# Patient Record
Sex: Female | Born: 1985 | Race: Black or African American | Hispanic: No | Marital: Married | Smoking: Never smoker
Health system: Southern US, Community
[De-identification: ages and names within clinical notes are randomized; demographics above are authoritative.]

## PROBLEM LIST (undated history)

## (undated) ENCOUNTER — Inpatient Hospital Stay (HOSPITAL_COMMUNITY): Payer: Medicaid Other

## (undated) ENCOUNTER — Emergency Department (HOSPITAL_COMMUNITY): Admission: EM | Payer: BLUE CROSS/BLUE SHIELD | Source: Home / Self Care

## (undated) DIAGNOSIS — D219 Benign neoplasm of connective and other soft tissue, unspecified: Secondary | ICD-10-CM

## (undated) DIAGNOSIS — O24419 Gestational diabetes mellitus in pregnancy, unspecified control: Secondary | ICD-10-CM

## (undated) DIAGNOSIS — Z789 Other specified health status: Secondary | ICD-10-CM

## (undated) HISTORY — DX: Gestational diabetes mellitus in pregnancy, unspecified control: O24.419

## (undated) HISTORY — DX: Other specified health status: Z78.9

## (undated) HISTORY — DX: Benign neoplasm of connective and other soft tissue, unspecified: D21.9

## (undated) HISTORY — PX: NO PAST SURGERIES: SHX2092

---

## 2018-11-04 ENCOUNTER — Emergency Department (HOSPITAL_COMMUNITY): Payer: Self-pay

## 2018-11-04 ENCOUNTER — Encounter (HOSPITAL_COMMUNITY): Payer: Self-pay

## 2018-11-04 ENCOUNTER — Emergency Department (HOSPITAL_COMMUNITY)
Admission: EM | Admit: 2018-11-04 | Discharge: 2018-11-04 | Disposition: A | Discharge status: Home / Self Care | Payer: Self-pay | Attending: Emergency Medicine | Admitting: Emergency Medicine

## 2018-11-04 ENCOUNTER — Other Ambulatory Visit: Payer: Self-pay

## 2018-11-04 DIAGNOSIS — J069 Acute upper respiratory infection, unspecified: Secondary | ICD-10-CM | POA: Insufficient documentation

## 2018-11-04 DIAGNOSIS — R0981 Nasal congestion: Secondary | ICD-10-CM | POA: Insufficient documentation

## 2018-11-04 DIAGNOSIS — B9789 Other viral agents as the cause of diseases classified elsewhere: Secondary | ICD-10-CM

## 2018-11-04 DIAGNOSIS — R05 Cough: Secondary | ICD-10-CM | POA: Insufficient documentation

## 2018-11-04 DIAGNOSIS — J988 Other specified respiratory disorders: Secondary | ICD-10-CM

## 2018-11-04 MED ORDER — BENZONATATE 100 MG PO CAPS: 100.0000 mg | ORAL_CAPSULE | Freq: Three times a day (TID) | ORAL | 0 refills | Status: DC | PRN

## 2018-11-04 MED ORDER — ACETAMINOPHEN ER 650 MG PO TBCR: 650.0000 mg | EXTENDED_RELEASE_TABLET | Freq: Three times a day (TID) | ORAL | 0 refills | Status: DC | PRN

## 2018-11-04 MED ORDER — FLUTICASONE PROPIONATE 50 MCG/ACT NA SUSP: 1.0000 | Freq: Every day | NASAL | 0 refills | Status: DC

## 2018-11-04 NOTE — ED Triage Notes (Signed)
Patient complains of intermittent congestion, dry cough and reports fever x 9 days. Lungs clear, no fever, no cough during assessment

## 2018-11-04 NOTE — ED Provider Notes (Signed)
[facility] EMERGENCY DEPARTMENT Provider Note   CSN: [id number] Arrival date & time: 11/04/18  1445    History   Chief Complaint Chief Complaint  Patient presents with  . Fever    HPI Nicole Lindsey is a 33 y.o. female without significant past medical hx who presents to the ED for subjective fevers x 9 days. Fevers are associated w/ nasal congestion & dry cough. Sxs constant, improved with OTC cold medicine, no other alleviating/aggravating factors. Denies ear pain, sore throat, dyspnea, chest pain, N/V/D, or abdominal pain. No recent travel. Her co-worker's husband who she has not been in direct contact w/ tested positive for covid 21, co-worker did not. Otherwise no confirmed covid 19 exposures.    Translator line utilized throughout encounter     HPI  History reviewed. No pertinent past medical history.  There are no active problems to display for this patient.   History reviewed. No pertinent surgical history.   OB History   No obstetric history on file.      Home Medications    Prior to Admission medications   Not on File    Family History No family history on file.  Social History Social History   Tobacco Use  . Smoking status: Not on file  Substance Use Topics  . Alcohol use: Not on file  . Drug use: Not on file     Allergies   Patient has no known allergies.   Review of Systems Review of Systems  Constitutional: Positive for fever.  HENT: Positive for congestion. Negative for ear pain and sore throat.   Respiratory: Positive for cough. Negative for shortness of breath.   Cardiovascular: Negative for chest pain, palpitations and leg swelling.  Gastrointestinal: Negative for abdominal pain, diarrhea, nausea and vomiting.  Genitourinary: Negative for dysuria.  Musculoskeletal: Negative for myalgias.  Neurological: Negative for syncope.  All other systems reviewed and are negative.    Physical Exam Updated Vital Signs BP  125/82 (BP Location: Right Arm)   Pulse 84   Temp 98.6 F (37 C) (Oral)   Resp 18   SpO2 99%   Physical Exam Vitals signs and nursing note reviewed.  Constitutional:      General: She is not in acute distress.    Appearance: She is well-developed.  HENT:     Head: Normocephalic and atraumatic.     Right Ear: Ear canal normal. Tympanic membrane is not perforated, erythematous, retracted or bulging.     Left Ear: Ear canal normal. Tympanic membrane is not perforated, erythematous, retracted or bulging.     Ears:     Comments: No mastoid erythema/swelling/tenderness.     Nose: Congestion present.     Right Sinus: No maxillary sinus tenderness or frontal sinus tenderness.     Left Sinus: No maxillary sinus tenderness or frontal sinus tenderness.     Mouth/Throat:     Pharynx: Uvula midline. No oropharyngeal exudate or posterior oropharyngeal erythema.     Comments: Posterior oropharynx is symmetric appearing. Patient tolerating own secretions without difficulty. No trismus. No drooling. No hot potato voice. No swelling beneath the tongue, submandibular compartment is soft.  Eyes:     General:        Right eye: No discharge.        Left eye: No discharge.     Conjunctiva/sclera: Conjunctivae normal.     Pupils: Pupils are equal, round, and reactive to light.  Neck:     Musculoskeletal: Normal range  of motion and neck supple. No edema or neck rigidity.  Cardiovascular:     Rate and Rhythm: Normal rate and regular rhythm.     Heart sounds: No murmur.  Pulmonary:     Effort: Pulmonary effort is normal. No respiratory distress.     Breath sounds: Normal breath sounds. No wheezing, rhonchi or rales.  Abdominal:     General: There is no distension.     Palpations: Abdomen is soft.     Tenderness: There is no abdominal tenderness.  Lymphadenopathy:     Cervical: No cervical adenopathy.  Skin:    General: Skin is warm and dry.     Findings: No rash.  Neurological:     Mental  Status: She is alert.  Psychiatric:        Behavior: Behavior normal.    ED Treatments / Results  Labs (all labs ordered are listed, but only abnormal results are displayed) Labs Reviewed - No data to display  EKG None  Radiology Dg Chest 2 View  Result Date: 11/04/2018 CLINICAL DATA:  Nonproductive cough and fever for 9 days. EXAM: CHEST - 2 VIEW COMPARISON:  None. FINDINGS: The cardiomediastinal silhouette is within normal limits. The lungs are slightly hypoinflated with slight accentuation of interstitial markings in the lung bases. No airspace consolidation, overt edema, pleural effusion, or pneumothorax is identified. No acute osseous abnormality is seen. IMPRESSION: Slight bibasilar density favored to reflect minimal atelectasis. Electronically Signed   By: Logan Bores M.D.   On: 11/04/2018 15:45    Procedures Procedures (including critical care time)  Medications Ordered in ED Medications - No data to display   Initial Impression / Assessment and Plan / ED Course  I have reviewed the triage vital signs and the nursing notes.  Pertinent labs & imaging results that were available during my care of the patient were reviewed by me and considered in my medical decision making (see chart for details).   Patient presents to the ED with fever, congestion, & dry cough. Nontoxic appearing, vitals WNL. Exam benign. No sinus tenderness, doubt acute bacterial sinusitis. Centor 0- doubt strep. No AOM on exam. No meningismus. Lungs CTA- no wheezing/respiratory distress. CXR negative for infiltrate doubt pneumonia. CXR w/ findings radiology suspected to reflect atelectasis. Overall sxs are likely viral, possibly covid 19, does not meet hospital criteria for testing, no sxs of dyspnea, SpO2> 95%, appears safe for discharge with quarantine instructions & symptomatic care. I discussed results, treatment plan, need for follow-up, and return precautions with the patient. Provided opportunity for  questions, patient confirmed understanding and is in agreement with plan.    Nicole Lindsey was evaluated in Emergency Department on 11/04/2018 for the symptoms described in the history of present illness. He/she was evaluated in the context of the global COVID-19 pandemic, which necessitated consideration that the patient might be at risk for infection with the SARS-CoV-2 virus that causes COVID-19. Institutional protocols and algorithms that pertain to the evaluation of patients at risk for COVID-19 are in a state of rapid change based on information released by regulatory bodies including the CDC and federal and state organizations. These policies and algorithms were followed during the patient's care in the ED.   Final Clinical Impressions(s) / ED Diagnoses   Final diagnoses:  Viral respiratory illness    ED Discharge Orders         Ordered    fluticasone (FLONASE) 50 MCG/ACT nasal spray  Daily     11/04/18 1619  acetaminophen (TYLENOL 8 HOUR) 650 MG CR tablet  Every 8 hours PRN     11/04/18 1619    benzonatate (TESSALON) 100 MG capsule  3 times daily PRN     11/04/18 1619           Hamdi Vari, Glynda Jaeger, PA-C 11/04/18 1810    Isla Pence, MD 11/04/18 [phone]

## 2018-11-04 NOTE — Discharge Instructions (Signed)
You were seen in the emergency today for upper respiratory symptoms, we suspect your symptoms are related to a virus, possibly covid 19. We are sending you home with the following medicines.   -Flonase to be used 1 spray in each nostril daily.  This medication is used to treat your congestion.  -Tessalon can be taken once every 8 hours as needed.  This medication is used to treat your cough.  -Tylenol- to take every 8 hours as needed for fever.   We have prescribed you new medication(s) today. Discuss the medications prescribed today with your pharmacist as they can have adverse effects and interactions with your other medicines including over the counter and prescribed medications. Seek medical evaluation if you start to experience new or abnormal symptoms after taking one of these medicines, seek care immediately if you start to experience difficulty breathing, feeling of your throat closing, facial swelling, or rash as these could be indications of a more serious allergic reaction  We have limited coronavirus testing at this time. We are instructing patient's with cough to quarantine themselves for 14 days. You may be able to discontinue self quarantine if the following conditions are met:   Persons with COVID-19 who have symptoms and were directed to care for themselves at home may discontinue home isolation under the  following conditions: - It has been at least 7 days have passed since symptoms first appeared. - AND at least 3 days (72 hours) have passed since recovery defined as resolution of fever without the use of fever-reducing medications and improvement in respiratory symptoms (e.g., cough, shortness of breath)  Please follow the below quarantine instructions.   Please follow up with primary care within 3-5 days for re-evaluation- call prior to going to the office to make them aware of your symptoms. Return to the ER for new or worsening symptoms including but not limited to increased  work of breathing, fever, chest pain, passing out, or any other concerns.       Person Under Monitoring Name: Nicole Lindsey  Location: [place] [address]   Infection Prevention Recommendations for Individuals Confirmed to have, or Being Evaluated for, 2019 Novel Coronavirus (COVID-19) Infection Who Receive Care at Home  Individuals who are confirmed to have, or are being evaluated for, COVID-19 should follow the prevention steps below until a healthcare provider or local or state health department says they can return to normal activities.  Stay home except to get medical care You should restrict activities outside your home, except for getting medical care. Do not go to work, school, or public areas, and do not use public transportation or taxis.  Call ahead before visiting your doctor Before your medical appointment, call the healthcare provider and tell them that you have, or are being evaluated for, COVID-19 infection. This will help the healthcare providers office take steps to keep other people from getting infected. Ask your healthcare provider to call the local or state health department.  Monitor your symptoms Seek prompt medical attention if your illness is worsening (e.g., difficulty breathing). Before going to your medical appointment, call the healthcare provider and tell them that you have, or are being evaluated for, COVID-19 infection. Ask your healthcare provider to call the local or state health department.  Wear a facemask You should wear a facemask that covers your nose and mouth when you are in the same room with other people and when you visit a healthcare provider. People who live with  or visit you should also wear a facemask while they are in the same room with you.  Separate yourself from other people in your home As much as possible, you should stay in a different room from other people in your home. Also, you should use a  separate bathroom, if available.  Avoid sharing household items You should not share dishes, drinking glasses, cups, eating utensils, towels, bedding, or other items with other people in your home. After using these items, you should wash them thoroughly with soap and water.  Cover your coughs and sneezes Cover your mouth and nose with a tissue when you cough or sneeze, or you can cough or sneeze into your sleeve. Throw used tissues in a lined trash can, and immediately wash your hands with soap and water for at least 20 seconds or use an alcohol-based hand rub.  Wash your [facility] your hands often and thoroughly with soap and water for at least 20 seconds. You can use an alcohol-based hand sanitizer if soap and water are not available and if your hands are not visibly dirty. Avoid touching your eyes, nose, and mouth with unwashed hands.   Prevention Steps for Caregivers and Household Members of Individuals Confirmed to have, or Being Evaluated for, COVID-19 Infection Being Cared for in the Home  If you live with, or provide care at home for, a person confirmed to have, or being evaluated for, COVID-19 infection please follow these guidelines to prevent infection:  Follow healthcare providers instructions Make sure that you understand and can help the patient follow any healthcare provider instructions for all care.  Provide for the patients basic needs You should help the patient with basic needs in the home and provide support for getting groceries, prescriptions, and other personal needs.  Monitor the patients symptoms If they are getting sicker, call his or her medical provider and tell them that the patient has, or is being evaluated for, COVID-19 infection. This will help the healthcare providers office take steps to keep other people from getting infected. Ask the healthcare provider to call the local or state health department.  Limit the number of people who have  contact with the patient If possible, have only one caregiver for the patient. Other household members should stay in another home or place of residence. If this is not possible, they should stay in another room, or be separated from the patient as much as possible. Use a separate bathroom, if available. Restrict visitors who do not have an essential need to be in the home.  Keep older adults, very young children, and other sick people away from the patient Keep older adults, very young children, and those who have compromised immune systems or chronic health conditions away from the patient. This includes people with chronic heart, lung, or kidney conditions, diabetes, and cancer.  Ensure good ventilation Make sure that shared spaces in the home have good air flow, such as from an air conditioner or an opened window, weather permitting.  Wash your hands often Wash your hands often and thoroughly with soap and water for at least 20 seconds. You can use an alcohol based hand sanitizer if soap and water are not available and if your hands are not visibly dirty. Avoid touching your eyes, nose, and mouth with unwashed hands. Use disposable paper towels to dry your hands. If not available, use dedicated cloth towels and replace them when they become wet.  Wear a facemask and gloves Wear a disposable  facemask at all times in the room and gloves when you touch or have contact with the patients blood, body fluids, and/or secretions or excretions, such as sweat, saliva, sputum, nasal mucus, vomit, urine, or feces.  Ensure the mask fits over your nose and mouth tightly, and do not touch it during use. Throw out disposable facemasks and gloves after using them. Do not reuse. Wash your hands immediately after removing your facemask and gloves. If your personal clothing becomes contaminated, carefully remove clothing and launder. Wash your hands after handling contaminated clothing. Place all used  disposable facemasks, gloves, and other waste in a lined container before disposing them with other household waste. Remove gloves and wash your hands immediately after handling these items.  Do not share dishes, glasses, or other household items with the patient Avoid sharing household items. You should not share dishes, drinking glasses, cups, eating utensils, towels, bedding, or other items with a patient who is confirmed to have, or being evaluated for, COVID-19 infection. After the person uses these items, you should wash them thoroughly with soap and water.  Wash laundry thoroughly Immediately remove and wash clothes or bedding that have blood, body fluids, and/or secretions or excretions, such as sweat, saliva, sputum, nasal mucus, vomit, urine, or feces, on them. Wear gloves when handling laundry from the patient. Read and follow directions on labels of laundry or clothing items and detergent. In general, wash and dry with the warmest temperatures recommended on the label.  Clean all areas the individual has used often Clean all touchable surfaces, such as counters, tabletops, doorknobs, bathroom fixtures, toilets, phones, keyboards, tablets, and bedside tables, every day. Also, clean any surfaces that may have blood, body fluids, and/or secretions or excretions on them. Wear gloves when cleaning surfaces the patient has come in contact with. Use a diluted bleach solution (e.g., dilute bleach with 1 part bleach and 10 parts water) or a household disinfectant with a label that says EPA-registered for coronaviruses. To make a bleach solution at home, add 1 tablespoon of bleach to 1 quart (4 cups) of water. For a larger supply, add  cup of bleach to 1 gallon (16 cups) of water. Read labels of cleaning products and follow recommendations provided on product labels. Labels contain instructions for safe and effective use of the cleaning product including precautions you should take when applying  the product, such as wearing gloves or eye protection and making sure you have good ventilation during use of the product. Remove gloves and wash hands immediately after cleaning.  Monitor yourself for signs and symptoms of illness Caregivers and household members are considered close contacts, should monitor their health, and will be asked to limit movement outside of the home to the extent possible. Follow the monitoring steps for close contacts listed on the symptom monitoring form.   ? If you have additional questions, contact your local health department or call the epidemiologist on call at [phone] (available 24/7). ? This guidance is subject to change. For the most up-to-date guidance from [facility], please refer to their website: [email or web address]

## 2018-11-15 ENCOUNTER — Encounter (HOSPITAL_COMMUNITY): Payer: Self-pay | Admitting: *Deleted

## 2018-11-15 ENCOUNTER — Other Ambulatory Visit: Payer: Self-pay

## 2018-11-15 ENCOUNTER — Emergency Department (HOSPITAL_COMMUNITY)
Admission: EM | Admit: 2018-11-15 | Discharge: 2018-11-15 | Disposition: A | Discharge status: Home / Self Care | Payer: BLUE CROSS/BLUE SHIELD | Attending: Emergency Medicine | Admitting: Emergency Medicine

## 2018-11-15 DIAGNOSIS — J019 Acute sinusitis, unspecified: Secondary | ICD-10-CM | POA: Diagnosis not present

## 2018-11-15 DIAGNOSIS — J329 Chronic sinusitis, unspecified: Secondary | ICD-10-CM

## 2018-11-15 DIAGNOSIS — R05 Cough: Secondary | ICD-10-CM | POA: Diagnosis present

## 2018-11-15 MED ORDER — ACETAMINOPHEN 325 MG PO TABS
650.0000 mg | ORAL_TABLET | Freq: Once | ORAL | Status: AC
  Administered 2018-11-15: 650 mg via ORAL
  Filled 2018-11-15: qty 2

## 2018-11-15 MED ORDER — AMOXICILLIN-POT CLAVULANATE 875-125 MG PO TABS: 1.0000 | ORAL_TABLET | Freq: Two times a day (BID) | ORAL | 0 refills | Status: AC

## 2018-11-15 NOTE — Discharge Instructions (Addendum)
°  You have been seen today for sinus congestion. Please read and follow all provided instructions. Return to the emergency room for worsening condition or new concerning symptoms.    1. Medications:  Prescription printed for Augmentin.  This is an antibiotic to help with your sinus infection.    Take medications as prescribed. Please review all of the medicines and only take them if you do not have an allergy to them.  Continue usual home medications  2. Treatment: rest, drink plenty of fluids  3. Follow Up: Please follow up with your primary doctor in 2-5 days for discussion of your diagnoses and further evaluation after today's visit; Call today to arrange your follow up.  If you do not have a primary care doctor use the resource guide provided to find one;  I have also included the information for [facility].  Please call to schedule follow-up appointment there if able to establish care with a primary care doctor.  It is also a possibility that you have an allergic reaction to any of the medicines that you have been prescribed - Everybody reacts differently to medications and while MOST people have no trouble with most medicines, you may have a reaction such as nausea, vomiting, rash, swelling, shortness of breath. If this is the case, please stop taking the medicine immediately and contact your physician.  ?

## 2018-11-15 NOTE — ED Provider Notes (Signed)
[facility] EMERGENCY DEPARTMENT Provider Note   CSN: [id number] Arrival date & time:       History   Chief Complaint Chief Complaint  Patient presents with  . Cough    HPI Encounter performed with audio [place] interpretor.  Nicole Lindsey is a 33 y.o. female otherwise healthy female presenting to emergency department today with chief complaint of nasal congestion x10 days.  Patient states she was seen here on 11 days ago with a cough and discharged home with Lewayne Bunting and Flonase.  Patient states the cough has improved and is no longer bothering her.  She first noticed she had nasal congestion and sinus tenderness after being discharged from her that ED visit. She describes yellow mucus. She has associated fever with T max of 99.4 yesterday.  She has not taken any medications for the fever but has attempted rubbing onion on her chest.  She has low-grade fever on arrival of 99.3.  Patient also states her tongue is sore and she noticed a bump on it.  She is unsure when this started, thinks it has been a few days. She denies loss of taste and smell, sore throat, dyspnea, chest pain, abdominal pain, nausea, vomiting, diarrhea.  Patient denies any sick contacts.  Also denies any contact with one positive for COVID-19. She has been self isolating since the last ED visit as recommended by the provider.  History provided by patient and through chart review.   History reviewed. No pertinent past medical history.  There are no active problems to display for this patient.   History reviewed. No pertinent surgical history.   OB History   No obstetric history on file.      Home Medications    Prior to Admission medications   Medication Sig Start Date End Date Taking? Authorizing Provider  acetaminophen (TYLENOL 8 HOUR) 650 MG CR tablet Take 1 tablet (650 mg total) by mouth every 8 (eight) hours as needed for pain. 11/04/18   Petrucelli, Samantha R, PA-C   amoxicillin-clavulanate (AUGMENTIN) 875-125 MG tablet Take 1 tablet by mouth every 12 (twelve) hours for 7 days. 11/15/18 11/22/18  Jeidy Hoerner E, PA-C  benzonatate (TESSALON) 100 MG capsule Take 1 capsule (100 mg total) by mouth 3 (three) times daily as needed for cough. 11/04/18   Petrucelli, Samantha R, PA-C  fluticasone (FLONASE) 50 MCG/ACT nasal spray Place 1 spray into both nostrils daily. 11/04/18   Petrucelli, Glynda Jaeger, PA-C    Family History History reviewed. No pertinent family history.  Social History Social History   Tobacco Use  . Smoking status: Never Smoker  . Smokeless tobacco: Never Used  Substance Use Topics  . Alcohol use: Not Currently  . Drug use: Not Currently     Allergies   Patient has no known allergies.   Review of Systems Review of Systems  Constitutional: Positive for fever. Negative for chills.  HENT: Positive for congestion, sinus pressure and sinus pain. Negative for ear pain, facial swelling, mouth sores, sore throat and trouble swallowing.   Eyes: Negative for pain, discharge, redness and itching.  Respiratory: Positive for cough. Negative for shortness of breath.   Cardiovascular: Negative for chest pain and leg swelling.  Gastrointestinal: Negative for abdominal pain, diarrhea, nausea and vomiting.  Genitourinary: Negative for dysuria and hematuria.  Musculoskeletal: Negative for arthralgias and myalgias.  Skin: Negative for wound.  Neurological: Negative for dizziness and headaches.     Physical Exam Updated Vital Signs BP 128/81 (BP  Location: Right Arm)   Pulse 61   Temp 99.3 F (37.4 C) (Oral)   Resp 14   SpO2 99%   Physical Exam Vitals signs and nursing note reviewed.  Constitutional:      Appearance: She is well-developed. She is not toxic-appearing.  HENT:     Head: Normocephalic.     Comments: Maxillary and frontal sinus tenderness.     Right Ear: Tympanic membrane and external ear normal. No middle ear effusion.  Tympanic membrane is not erythematous.     Left Ear: Tympanic membrane and external ear normal.  No middle ear effusion. Tympanic membrane is not erythematous.     Nose: Congestion present.     Mouth/Throat:     Comments: No erythema to oropharynx, no edema, no exudate, no tonsillar swelling, voice normal, neck supple without lymphadenopathy. There is no notable swelling of tongue. There is one aphthous ulcer on left inner cheek without surrounding edema or drainage. Eyes:     General: No allergic shiner or scleral icterus.       Right eye: No discharge.        Left eye: No discharge.     Conjunctiva/sclera: Conjunctivae normal.     Right eye: Right conjunctiva is not injected.     Left eye: Left conjunctiva is not injected.  Neck:     Musculoskeletal: Normal range of motion.  Cardiovascular:     Rate and Rhythm: Normal rate and regular rhythm.     Pulses: Normal pulses.     Heart sounds: Normal heart sounds.  Pulmonary:     Effort: Pulmonary effort is normal.     Breath sounds: Normal breath sounds.  Abdominal:     General: There is no distension.  Musculoskeletal: Normal range of motion.  Skin:    General: Skin is warm and dry.  Neurological:     Mental Status: She is oriented to person, place, and time.     Comments: Fluent speech, no facial droop.  Psychiatric:        Behavior: Behavior normal.      ED Treatments / Results  Labs (all labs ordered are listed, but only abnormal results are displayed) Labs Reviewed - No data to display  EKG None  Radiology No results found.  Procedures Procedures (including critical care time)  Medications Ordered in ED Medications  acetaminophen (TYLENOL) tablet 650 mg (650 mg Oral Given 11/15/18 2034)     Initial Impression / Assessment and Plan / ED Course  I have reviewed the triage vital signs and the nursing notes.  Pertinent labs & imaging results that were available during my care of the patient were reviewed by me  and considered in my medical decision making (see chart for details).  Pt with low grade fever of 99.3 on arrival. She has not taken any antipyretics in the last several days. She reports her cough has improved. She had a chest xray at previous ED visit that showed was negative for infiltrate, given that cough has improved do not feel chest xray should be repeated today in an attempt to subject pt to less radiation exposure. Pt given PO tylenol for fever here.  Her symptoms today are suggestive of sinusitis.   Severe symptoms have been present for 10 days with purulent nasal discharge and maxillary sinus pain.  Concern for acute bacterial rhinosinusitis.  Patient discharged with Augmentin.  Instructions given for warm saline nasal wash and recommendations for follow-up with primary care physician.  She  does not have a pcp, she was given information for  community health and wellness clinic with discharge paperwork. Strict return precautions for returning to the ED were discussed.     Nicole Lindsey was evaluated in Emergency Department on 11/16/2018 for the symptoms described in the history of present illness. She was evaluated in the context of the global COVID-19 pandemic, which necessitated consideration that the patient might be at risk for infection with the SARS-CoV-2 virus that causes COVID-19. Institutional protocols and algorithms that pertain to the evaluation of patients at risk for COVID-19 are in a state of rapid change based on information released by regulatory bodies including the CDC and federal and state organizations. These policies and algorithms were followed during the patient's care in the ED.   Final Clinical Impressions(s) / ED Diagnoses   Final diagnoses:  Sinusitis, unspecified chronicity, unspecified location    ED Discharge Orders         Ordered    amoxicillin-clavulanate (AUGMENTIN) 875-125 MG tablet  Every 12 hours     11/15/18 2029           Cherre Robins, PA-C 11/16/18 1306    Lajean Saver, MD 11/16/18 [phone]

## 2018-11-15 NOTE — ED Triage Notes (Signed)
PT reports last seen on 4-15 with same cough . Cough has not improved. Pt reports her mouth is sore ,this is new.

## 2018-11-24 ENCOUNTER — Emergency Department (HOSPITAL_COMMUNITY)
Admission: EM | Admit: 2018-11-24 | Discharge: 2018-11-25 | Disposition: A | Discharge status: Home / Self Care | Payer: BLUE CROSS/BLUE SHIELD | Attending: Emergency Medicine | Admitting: Emergency Medicine

## 2018-11-24 ENCOUNTER — Other Ambulatory Visit: Payer: Self-pay

## 2018-11-24 ENCOUNTER — Encounter (HOSPITAL_COMMUNITY): Payer: Self-pay | Admitting: Emergency Medicine

## 2018-11-24 ENCOUNTER — Emergency Department (HOSPITAL_COMMUNITY): Payer: BLUE CROSS/BLUE SHIELD

## 2018-11-24 DIAGNOSIS — B9789 Other viral agents as the cause of diseases classified elsewhere: Secondary | ICD-10-CM

## 2018-11-24 DIAGNOSIS — Z79899 Other long term (current) drug therapy: Secondary | ICD-10-CM | POA: Insufficient documentation

## 2018-11-24 DIAGNOSIS — J069 Acute upper respiratory infection, unspecified: Secondary | ICD-10-CM | POA: Diagnosis not present

## 2018-11-24 DIAGNOSIS — J988 Other specified respiratory disorders: Principal | ICD-10-CM

## 2018-11-24 DIAGNOSIS — R0600 Dyspnea, unspecified: Secondary | ICD-10-CM | POA: Diagnosis present

## 2018-11-24 MED ORDER — IBUPROFEN 400 MG PO TABS
400.0000 mg | ORAL_TABLET | Freq: Once | ORAL | Status: AC
  Administered 2018-11-25: 400 mg via ORAL
  Filled 2018-11-24: qty 1

## 2018-11-24 MED ORDER — ALBUTEROL SULFATE HFA 108 (90 BASE) MCG/ACT IN AERS
2.0000 | INHALATION_SPRAY | Freq: Once | RESPIRATORY_TRACT | Status: AC
  Administered 2018-11-25: 2 via RESPIRATORY_TRACT
  Filled 2018-11-24: qty 6.7

## 2018-11-24 MED ORDER — PREDNISONE 20 MG PO TABS
60.0000 mg | ORAL_TABLET | Freq: Once | ORAL | Status: AC
  Administered 2018-11-25: 60 mg via ORAL
  Filled 2018-11-24: qty 3

## 2018-11-24 NOTE — ED Provider Notes (Signed)
[facility] EMERGENCY DEPARTMENT Provider Note   CSN: [id number] Arrival date & time: 11/24/18  2227    History   Chief Complaint Chief Complaint  Patient presents with  . Shortness of Breath    HPI Nicole Lindsey is a 33 y.o. female.   The history is provided by the patient. A language interpreter was used [place]).  She comes in because of difficulty breathing tonight.  She has had a respiratory illness for the last 3 weeks and has been seen in the emergency department twice for that.  10 days ago she was given a prescription for Augmentin for presumed sinusitis and that has improved.  She still has occasional nasal drainage.  She does have a cough which is intermittently productive of white sputum.  She is also complaining of pain in the interscapular area.  She denies other arthralgias or myalgias.  She denies sore throat, nausea, vomiting, diarrhea.  She has had subjective fevers without chills or sweats.  She denies any sick contacts.  History reviewed. No pertinent past medical history.  There are no active problems to display for this patient.   History reviewed. No pertinent surgical history.   OB History   No obstetric history on file.      Home Medications    Prior to Admission medications   Medication Sig Start Date End Date Taking? Authorizing Provider  acetaminophen (TYLENOL 8 HOUR) 650 MG CR tablet Take 1 tablet (650 mg total) by mouth every 8 (eight) hours as needed for pain. 11/04/18   Petrucelli, Samantha R, PA-C  benzonatate (TESSALON) 100 MG capsule Take 1 capsule (100 mg total) by mouth 3 (three) times daily as needed for cough. 11/04/18   Petrucelli, Samantha R, PA-C  fluticasone (FLONASE) 50 MCG/ACT nasal spray Place 1 spray into both nostrils daily. 11/04/18   Petrucelli, Glynda Jaeger, PA-C    Family History History reviewed. No pertinent family history.  Social History Social History   Tobacco Use  . Smoking status: Never Smoker  .  Smokeless tobacco: Never Used  Substance Use Topics  . Alcohol use: Not Currently  . Drug use: Not Currently     Allergies   Patient has no known allergies.   Review of Systems Review of Systems  All other systems reviewed and are negative.    Physical Exam Updated Vital Signs BP 132/76 (BP Location: Right Arm)   Pulse 79   Temp 98.5 F (36.9 C) (Oral)   Resp 18   LMP 11/17/2018 (Approximate)   SpO2 97%   Physical Exam Vitals signs and nursing note reviewed.    33 year old female, resting comfortably and in no acute distress. Vital signs are normal. Oxygen saturation is 97%, which is normal. Head is normocephalic and atraumatic. PERRLA, EOMI. Oropharynx is clear. Neck is nontender and supple without adenopathy or JVD. Back is nontender and there is no CVA tenderness. Lungs are clear without rales, wheezes, or rhonchi.  Exhalation phase is slightly prolonged. Chest is nontender. Heart has regular rate and rhythm without murmur. Abdomen is soft, flat, nontender without masses or hepatosplenomegaly and peristalsis is normoactive. Extremities have no cyanosis or edema, full range of motion is present. Skin is warm and dry without rash. Neurologic: Mental status is normal, cranial nerves are intact, there are no motor or sensory deficits.  ED Treatments / Results   EKG EKG Interpretation  Date/Time:  Tuesday Nov 24 2018 23:02:09 EDT Ventricular Rate:  73 PR Interval:  162  QRS Duration: 84 QT Interval:  368 QTC Calculation: 405 R Axis:   68 Text Interpretation:  Normal sinus rhythm Normal ECG No old tracing to compare Confirmed by Delora Fuel ([id number]) on 11/24/2018 11:21:59 PM   Radiology Dg Chest 2 View  Result Date: 11/24/2018 CLINICAL DATA:  33 y/o  F; shortness of breath. EXAM: CHEST - 2 VIEW COMPARISON:  11/04/2018 chest radiograph. FINDINGS: Stable heart size and mediastinal contours are within normal limits. Both lungs are clear. The visualized skeletal  structures are unremarkable. IMPRESSION: No acute pulmonary process identified. Electronically Signed   By: Kristine Garbe M.D.   On: 11/24/2018 23:34    Procedures Procedures   Medications Ordered in ED Medications  albuterol (VENTOLIN HFA) 108 (90 Base) MCG/ACT inhaler 2 puff (has no administration in time range)  predniSONE (DELTASONE) tablet 60 mg (has no administration in time range)  ibuprofen (ADVIL) tablet 400 mg (has no administration in time range)     Initial Impression / Assessment and Plan / ED Course  I have reviewed the triage vital signs and the nursing notes.  Pertinent labs & imaging results that were available during my care of the patient were reviewed by me and considered in my medical decision making (see chart for details).  Dyspnea as part of a respiratory illness.  This certainly could be UEAVW-09, but symptoms are relatively mild.  She is maintaining good oxygen saturation today.  Chest x-ray shows no pneumonia and ECG is normal.  She will be given a therapeutic trial of albuterol inhaler and given initial dose of prednisone.  Also given ibuprofen for pain.  Review of old records confirms 2 recent ED visits for respiratory illness, possible mild COVID-19.  Nicole Lindsey was evaluated in Emergency Department on 11/24/2018 for the symptoms described in the history of present illness. She was evaluated in the context of the global COVID-19 pandemic, which necessitated consideration that the patient might be at risk for infection with the SARS-CoV-2 virus that causes COVID-19. Institutional protocols and algorithms that pertain to the evaluation of patients at risk for COVID-19 are in a state of rapid change based on information released by regulatory bodies including the CDC and federal and state organizations. These policies and algorithms were followed during the patient's care in the ED.  She feels much better after above-noted treatment.  She is discharged with  a prescription for prednisone, given the albuterol inhaler to take home with her.  Final Clinical Impressions(s) / ED Diagnoses   Final diagnoses:  Viral respiratory infection    ED Discharge Orders         Ordered    predniSONE (DELTASONE) 20 MG tablet  Daily     11/25/18 8119           Delora Fuel, MD 14/78/29 [phone]

## 2018-11-24 NOTE — ED Triage Notes (Addendum)
Pt was lying down tonight and felt like she couldn't breathe, had pain in both shoulders. Has some SOB now. Was seen on the 15th and was told she had signs of COVID and has not gotten better, was not tested.

## 2018-11-25 MED ORDER — PREDNISONE 20 MG PO TABS: 60.0000 mg | ORAL_TABLET | Freq: Every day | ORAL | 0 refills | Status: DC

## 2018-11-25 NOTE — Discharge Instructions (Addendum)
Koresha impemu nkuko bikenewe - puffs ebyiri kenshi nka buri masaha ane. Koresha impemu kugirango uvure inkorora cyangwa guhumeka neza.  Fata acetaminofeni cyangwa ibuprofen nkuko bikenewe kubabara cyangwa umuriro.

## 2018-12-04 ENCOUNTER — Encounter (HOSPITAL_COMMUNITY): Payer: Self-pay | Admitting: *Deleted

## 2018-12-04 ENCOUNTER — Emergency Department (HOSPITAL_COMMUNITY)
Admission: EM | Admit: 2018-12-04 | Discharge: 2018-12-04 | Disposition: A | Discharge status: Home / Self Care | Payer: BLUE CROSS/BLUE SHIELD | Attending: Emergency Medicine | Admitting: Emergency Medicine

## 2018-12-04 ENCOUNTER — Emergency Department (HOSPITAL_COMMUNITY): Payer: BLUE CROSS/BLUE SHIELD

## 2018-12-04 ENCOUNTER — Other Ambulatory Visit: Payer: Self-pay

## 2018-12-04 DIAGNOSIS — Z20828 Contact with and (suspected) exposure to other viral communicable diseases: Secondary | ICD-10-CM | POA: Insufficient documentation

## 2018-12-04 DIAGNOSIS — R05 Cough: Secondary | ICD-10-CM | POA: Diagnosis present

## 2018-12-04 DIAGNOSIS — B349 Viral infection, unspecified: Secondary | ICD-10-CM | POA: Insufficient documentation

## 2018-12-04 DIAGNOSIS — R059 Cough, unspecified: Secondary | ICD-10-CM

## 2018-12-04 LAB — GROUP A STREP BY PCR: Group A Strep by PCR: NOT DETECTED

## 2018-12-04 MED ORDER — ACETAMINOPHEN 325 MG PO TABS
650.0000 mg | ORAL_TABLET | Freq: Once | ORAL | Status: AC
  Administered 2018-12-04: 650 mg via ORAL
  Filled 2018-12-04: qty 2

## 2018-12-04 NOTE — ED Notes (Signed)
Iv attempt unsuccessful x 1

## 2018-12-04 NOTE — ED Triage Notes (Signed)
Patient is here with reported dry cough, sob, headaches, fevers for the past one month.  She has not taken any medications today.  Patient with no reported n/v/d.  Patient is eating and drinking.

## 2018-12-04 NOTE — Discharge Instructions (Addendum)
You have been seen today for cough, fever, shortness of breath. We tested you for COVID-19. If positive, you will receive a call. If negative, you will not receive a call. Please follow strict quarantine instructions while you have symptoms. Please read and follow all provided instructions.   1. Medications: tylenol for fever and body aches, usual home medications 2. Treatment: rest, drink plenty of fluids 3. Follow Up: Please follow up with your primary doctor in 2 days for discussion of your diagnoses and further evaluation after today's visit; if you do not have a primary care doctor use the resource guide provided to find one; Please return to the ER for any new or worsening symptoms. Please obtain all of your results from medical records or have your doctors office obtain the results - share them with your doctor - you should be seen at your doctors office. Call today to arrange your follow up.  4. Please follow instructions for isolation. We did not test you for COVID-19 (coronavirus), but it is still a possibility that you may have been exposed. Please isolate yourself for at least 3 days since recovery without a fever, improvement in respiratory symptoms (cough, shortness of breath), and at least 7 days have passed since symptoms first appeared.   Take medications as prescribed. Please review all of the medicines and only take them if you do not have an allergy to them. Return to the emergency room for worsening condition or new concerning symptoms. Follow up with your regular doctor. If you don't have a regular doctor use one of the numbers below to establish a primary care doctor.  Please be aware that if you are taking birth control pills, taking other prescriptions, ESPECIALLY ANTIBIOTICS may make the birth control ineffective - if this is the case, either do not engage in sexual activity or use alternative methods of birth control such as condoms until you have finished the medicine and your  family doctor says it is OK to restart them. If you are on a blood thinner such as COUMADIN, be aware that any other medicine that you take may cause the coumadin to either work too much, or not enough - you should have your coumadin level rechecked in next 7 days if this is the case.  ?  It is also a possibility that you have an allergic reaction to any of the medicines that you have been prescribed - Everybody reacts differently to medications and while MOST people have no trouble with most medicines, you may have a reaction such as nausea, vomiting, rash, swelling, shortness of breath. If this is the case, please stop taking the medicine immediately and contact your physician.  ?  You should return to the ER if you develop severe or worsening symptoms.   Emergency Department Resource Guide 1) Find a Doctor and Pay Out of Pocket Although you won't have to find out who is covered by your insurance plan, it is a good idea to ask around and get recommendations. You will then need to call the office and see if the doctor you have chosen will accept you as a new patient and what types of options they offer for patients who are self-pay. Some doctors offer discounts or will set up payment plans for their patients who do not have insurance, but you will need to ask so you aren't surprised when you get to your appointment.  2) Contact Your Local Health Department Not all health departments have doctors that can  see patients for sick visits, but many do, so it is worth a call to see if yours does. If you don't know where your local health department is, you can check in your phone book. The CDC also has a tool to help you locate your state's health department, and many state websites also have listings of all of their local health departments.  3) Find a [facility] If your illness is not likely to be very severe or complicated, you may want to try a walk in clinic. These are popping up all over the  country in pharmacies, drugstores, and shopping centers. They're usually staffed by nurse practitioners or physician assistants that have been trained to treat common illnesses and complaints. They're usually fairly quick and inexpensive. However, if you have serious medical issues or chronic medical problems, these are probably not your best option.  No Primary Care Doctor: Call [facility] Connect at  [phone] - they can help you locate a primary care doctor that  accepts your insurance, provides certain services, etc. Physician Referral Service- [phone]  Chronic Pain Problems: Organization         Address  Phone   Notes  [facility]  [phone] Patients need to be referred by their primary care doctor.   Medication Assistance: Organization         Address  Phone   Notes  [facility] Medication [facility] [place], [place] [address] [phone] --Must be a resident of [facility] -- Must have NO insurance coverage whatsoever (no Medicaid/ Medicare, etc.) -- The pt. MUST have a primary care doctor that directs their care regularly and follows them in the community   [facility]  [phone]   [employer]  [phone]    Agencies that provide inexpensive medical care: Organization         Address  Phone   Notes  [place]  [phone]   [facility]    [phone]   [facility] [place], [place] [address] [phone]   [place] [address] [place] [phone]   [facility]    [phone]   [facility]    [phone]   [place] [address] [place] [address], [place] Phone:  [phone], Fax:  [phone] Hours of Operation:  9 am - 6 pm, M-F.  Also accepts Medicaid/Medicare and self-pay.  [facility] for [place], [address],  [place] Phone: [phone], Fax: [phone]. Hours of Operation:  8:30 am - 5:30 pm, M-F.  Also accepts Medicaid and self-pay.  [facility] [address], [place] Phone: [phone]   [place], [place], [place] [phone], Ext. [phone] Mondays & Thursdays: 7-9 AM.  First 15 patients are seen on a first come, first serve basis.    [place] Providers:  Organization         Address  Phone   Notes  [facility] [address], [place] [phone] Also accepts self-pay patients.  [place], [place]  [phone]   [place], [address], [place] [phone]   [facility] [address], [place] [phone])  [place] [address] [place]   [phone] Only accepts [place] patients after they have their name applied to their card.   Self-Pay (no insurance) in [facility]:  Organization         Address  Phone   Notes  Sickle Cell Patients, [facility] [place] [phone]   [facility] [place] [phone]   [facility] [place]  [place], [place], [place] [phone]   [facility]/Dr. Osei-Bonsu  [address], [place] or [address], [place] [phone] Phone number for both [place] and [place] locations is the same.  [facility] and [facility] [address], [place] [phone]   [facility] [address] [place] or [address] [phone]   [facility] [address] [place] [phone], phone; [phone], fax Sees patients 1st and 3rd Saturday of every month.  Must not  qualify for public or private insurance (i.e. Medicaid, Medicare, [place] Health Choice, Veterans' Benefits)  Household income should be no more than 200% of the poverty level The clinic cannot treat you if you are pregnant or think you are pregnant  Sexually transmitted diseases are not treated at the clinic.

## 2018-12-04 NOTE — ED Notes (Signed)
Portable c xray at pt bedside

## 2018-12-04 NOTE — ED Notes (Signed)
Attempted IV unsuccessfully.  Will put in request for IV team.

## 2018-12-04 NOTE — ED Provider Notes (Signed)
[facility] EMERGENCY DEPARTMENT Provider Note   CSN: [id number] Arrival date & time: 12/04/18  1648    History   Chief Complaint Chief Complaint  Patient presents with  . Cough  . Fever  . Headache  . Shortness of Breath    HPI Nicole Lindsey is a 33 y.o. female with no significant past medical history presenting with an intermittent dry cough, subjective fever, body aches, frontal headache, and shortness of breath onset 1 month ago. [place] interpreter was used this encounter. Patient reports nothing makes symptoms better or worse. Patient reports she has been evaluated in the ER for similar symptoms this month. Patient denies taking any medications recently. Per chart review, patient was prescribed Augmentin for possible bacterial sinusitis on 04/26. Patient was also prescribed Prednisone on 05/05 for viral illness. Patient denies a headache currently. Patient reports fatigue, mild sore throat, mild congestion, sinus pain, and sinus pressure. Patient states nasal congestion has improved since onset. Patient denies syncope, weakness, chest pain, vision changes, numbness, or paresthesias. Patient denies sick exposures, known COVID-19 exposure, or recent travel. Patient denies nausea, vomiting, abdominal pain, or diarrhea. LMP on 11/17/2018.      HPI  History reviewed. No pertinent past medical history.  There are no active problems to display for this patient.   History reviewed. No pertinent surgical history.   OB History   No obstetric history on file.      Home Medications    Prior to Admission medications   Medication Sig Start Date End Date Taking? Authorizing Provider  acetaminophen (TYLENOL 8 HOUR) 650 MG CR tablet Take 1 tablet (650 mg total) by mouth every 8 (eight) hours as needed for pain. 11/04/18   Petrucelli, Samantha R, PA-C  fluticasone (FLONASE) 50 MCG/ACT nasal spray Place 1 spray into both nostrils daily. 11/04/18   Petrucelli, Samantha R,  PA-C  predniSONE (DELTASONE) 20 MG tablet Take 3 tablets (60 mg total) by mouth daily. 09/21/80   Delora Fuel, MD    Family History No family history on file.  Social History Social History   Tobacco Use  . Smoking status: Never Smoker  . Smokeless tobacco: Never Used  Substance Use Topics  . Alcohol use: Not Currently  . Drug use: Not Currently     Allergies   Patient has no known allergies.   Review of Systems Review of Systems  Constitutional: Positive for fatigue and fever. Negative for chills, diaphoresis and unexpected weight change.  HENT: Positive for congestion, rhinorrhea, sinus pressure, sinus pain and sore throat. Negative for ear pain, trouble swallowing and voice change.   Eyes: Negative for pain and visual disturbance.  Respiratory: Positive for cough and shortness of breath. Negative for chest tightness, wheezing and stridor.   Cardiovascular: Negative for chest pain, palpitations and leg swelling.  Gastrointestinal: Negative for abdominal pain, blood in stool, diarrhea, nausea and vomiting.  Endocrine: Negative for cold intolerance and heat intolerance.  Genitourinary: Negative for dysuria and frequency.  Musculoskeletal: Positive for myalgias. Negative for gait problem, neck pain and neck stiffness.  Skin: Negative for pallor and rash.  Allergic/Immunologic: Negative for environmental allergies, food allergies and immunocompromised state.  Neurological: Negative for dizziness, syncope, speech difficulty, weakness, light-headedness and numbness.  Hematological: Negative for adenopathy.  Psychiatric/Behavioral: The patient is not nervous/anxious.     Physical Exam Updated Vital Signs BP 132/72   Pulse 67   Temp 99.2 F (37.3 C) (Oral)   Resp 18   Wt 88.9 kg  LMP 11/17/2018 (Approximate)   SpO2 100%   Physical Exam Vitals signs and nursing note reviewed.  Constitutional:      General: She is not in acute distress.    Appearance: She is  well-developed. She is not diaphoretic.  HENT:     Head: Normocephalic and atraumatic.     Right Ear: Tympanic membrane, ear canal and external ear normal. No middle ear effusion.     Left Ear: Tympanic membrane, ear canal and external ear normal.  No middle ear effusion.     Nose: Mucosal edema present. No rhinorrhea.     Right Sinus: Maxillary sinus tenderness and frontal sinus tenderness (Mild frontal and maxillary tenderness bilaterally.) present.     Left Sinus: Maxillary sinus tenderness and frontal sinus tenderness present.     Mouth/Throat:     Dentition: Normal dentition.     Pharynx: Uvula midline. Posterior oropharyngeal erythema present. No oropharyngeal exudate.     Comments: Patient is speaking in full sentences without difficulty. No signs of drooling or muffled voice. Eyes:     General:        Right eye: No discharge.        Left eye: No discharge.     Conjunctiva/sclera: Conjunctivae normal.  Neck:     Musculoskeletal: Normal range of motion and neck supple.  Cardiovascular:     Rate and Rhythm: Normal rate and regular rhythm.     Heart sounds: Normal heart sounds. No murmur. No friction rub. No gallop.   Pulmonary:     Effort: Pulmonary effort is normal. No respiratory distress.     Breath sounds: Normal breath sounds. No wheezing or rales.  Abdominal:     Palpations: Abdomen is soft.     Tenderness: There is no abdominal tenderness.  Musculoskeletal: Normal range of motion.  Lymphadenopathy:     Cervical: No cervical adenopathy.  Skin:    General: Skin is warm.     Findings: No rash.  Neurological:     Mental Status: She is alert and oriented to person, place, and time.    Mental Status:  Alert, oriented, thought content appropriate, able to give a coherent history. Speech fluent without evidence of aphasia. Able to follow 2 step commands without difficulty.  Cranial Nerves:  II:  Peripheral visual fields grossly normal, pupils equal, round, reactive to  light III,IV, VI: ptosis not present, extra-ocular motions intact bilaterally  V,VII: smile symmetric, facial light touch sensation equal VIII: hearing grossly normal to voice  IX,X: symmetric elevation of soft palate, uvula elevates symmetrically  XI: bilateral shoulder shrug symmetric and strong XII: midline tongue extension without fassiculations Motor:  Normal tone. 5/5 in upper and lower extremities bilaterally including strong and equal grip strength and dorsiflexion/plantar flexion Sensory: Pinprick and light touch normal in all extremities.  Deep Tendon Reflexes: 2+ and symmetric in the biceps and patella Cerebellar: normal finger-to-nose with bilateral upper extremities Gait: normal gait and balance.  CV: distal pulses palpable throughout   ED Treatments / Results  Labs (all labs ordered are listed, but only abnormal results are displayed) Labs Reviewed  GROUP A STREP BY PCR  NOVEL CORONAVIRUS, NAA [facility] ORDER, SEND-OUT TO REF LAB)    EKG EKG Interpretation  Date/Time:  Friday Dec 04 2018 18:51:43 EDT Ventricular Rate:  67 PR Interval:    QRS Duration: 80 QT Interval:  375 QTC Calculation: 396 R Axis:   83 Text Interpretation:  Sinus rhythm Confirmed by Elnora Morrison [phone]) on  12/04/2018 7:53:30 PM   Radiology Dg Chest Port 1 View  Result Date: 12/04/2018 CLINICAL DATA:  Shortness of breath EXAM: PORTABLE CHEST 1 VIEW COMPARISON:  Nov 24, 2018 FINDINGS: No edema or consolidation. Heart size and pulmonary vascularity are normal. No pneumothorax. No adenopathy. No bone lesions. IMPRESSION: No edema or consolidation. Electronically Signed   By: Lowella Grip III M.D.   On: 12/04/2018 19:09    Procedures Procedures (including critical care time)  Medications Ordered in ED Medications  acetaminophen (TYLENOL) tablet 650 mg (650 mg Oral Given 12/04/18 1928)     Initial Impression / Assessment and Plan / ED Course  I have reviewed the triage vital signs  and the nursing notes.  Pertinent labs & imaging results that were available during my care of the patient were reviewed by me and considered in my medical decision making (see chart for details).  Clinical Course as of Dec 03 2033  Fri Dec 04, 2018  1917 No edema or consolidation noted on CXR.  DG Chest Port 1 View [AH]    Clinical Course User Index [AH] Arville Lime, [place]      Patient presents with URI symptoms. Pt CXR negative for acute infiltrate. Vitals and imaging reviewed. Suspect viral illness is causing symptoms. Will order COVID-19 send out testing due to frequent ED visits for similar symptoms. Patient was prescribed Augmentin earlier this month with relief. Do not suspect bacterial sinusitis at this time. Oxygen saturation has remained stable at 99% on room air. Discussed that COVID-19 is still possible even without known contacts. Discussed quarantine instructions per CDC guidelines. Pt will be discharged with symptomatic treatment.  Verbalizes understanding and is agreeable with plan. Pt is hemodynamically stable & in NAD prior to dc.  Findings and plan of care discussed with supervising physician Dr. Reather Converse who personally evaluated and examined this patient.  Final Clinical Impressions(s) / ED Diagnoses   Final diagnoses:  Cough  Viral illness    ED Discharge Orders    None       Julienne Kass 12/04/18 2035    Elnora Morrison, MD 12/05/18 8295    Elnora Morrison, MD 12/05/18 [phone]

## 2018-12-08 LAB — NOVEL CORONAVIRUS, NAA (HOSP ORDER, SEND-OUT TO REF LAB; TAT 18-24 HRS): SARS-CoV-2, NAA: NOT DETECTED

## 2018-12-13 ENCOUNTER — Encounter (HOSPITAL_COMMUNITY): Payer: Self-pay | Admitting: Emergency Medicine

## 2018-12-13 ENCOUNTER — Ambulatory Visit (HOSPITAL_COMMUNITY)
Admission: EM | Admit: 2018-12-13 | Discharge: 2018-12-13 | Disposition: A | Discharge status: Home / Self Care | Payer: BLUE CROSS/BLUE SHIELD | Attending: Family Medicine | Admitting: Family Medicine

## 2018-12-13 ENCOUNTER — Other Ambulatory Visit: Payer: Self-pay

## 2018-12-13 DIAGNOSIS — R0981 Nasal congestion: Secondary | ICD-10-CM

## 2018-12-13 DIAGNOSIS — R51 Headache: Secondary | ICD-10-CM

## 2018-12-13 DIAGNOSIS — J3489 Other specified disorders of nose and nasal sinuses: Secondary | ICD-10-CM | POA: Diagnosis not present

## 2018-12-13 DIAGNOSIS — Z9109 Other allergy status, other than to drugs and biological substances: Secondary | ICD-10-CM

## 2018-12-13 DIAGNOSIS — R531 Weakness: Secondary | ICD-10-CM

## 2018-12-13 MED ORDER — CETIRIZINE-PSEUDOEPHEDRINE ER 5-120 MG PO TB12: 1.0000 | ORAL_TABLET | Freq: Two times a day (BID) | ORAL | 0 refills | Status: DC

## 2018-12-13 NOTE — ED Triage Notes (Addendum)
Pt presents to [facility] for assessment of nasal congestion, bilateral ear pain, cough, back pain x 3 weeks, intermittent in nature.  States cough is exacerbated by cold air.  States back pain is mostly when she is coughing.  Pt states she was given Amoxicillin in the ER a few weeks ago without relief.  Interpreter used for the entirety of triage.

## 2018-12-13 NOTE — Discharge Instructions (Addendum)
Drink plenty of water Take the allergy medicine every day  See a primary care doctor for follow and check up

## 2018-12-13 NOTE — ED Provider Notes (Signed)
[facility]    CSN: [id number] Arrival date & time: 12/13/18  1342     History   Chief Complaint Chief Complaint  Patient presents with  . URI    HPI Nicole Lindsey is a 33 y.o. female.   HPI  Patient is here for follow-up.  She has been in the emergency room 4 times in the last 5 or 6 weeks for viral respiratory infections, sinus symptoms, cough.  Her COVID-19 is negative.  Chest x-ray and EKG have been normal.  Rapid strep test negative. She states that she has not work since April 10 when she became ill.  Her complaints today are ear pressure and pain.  Face pressure and headache.  No postnasal drip or purulence.  No fever chills.  No nausea vomiting.  No sore throat.  Occasional cough and clearing throat.  No shortness of breath.  She states the reason she has not worked is that she feels "so weak".  Says she is eating well.  Sleeping well.  Feels tired throughout the day. Entire history physical examination and discharge instructions were performed with interpreter  History reviewed. No pertinent past medical history.  There are no active problems to display for this patient.   History reviewed. No pertinent surgical history.  OB History   No obstetric history on file.      Home Medications    Prior to Admission medications   Medication Sig Start Date End Date Taking? Authorizing Provider  acetaminophen (TYLENOL 8 HOUR) 650 MG CR tablet Take 1 tablet (650 mg total) by mouth every 8 (eight) hours as needed for pain. 11/04/18   Petrucelli, Samantha R, PA-C  cetirizine-pseudoephedrine (ZYRTEC-D) 5-120 MG tablet Take 1 tablet by mouth 2 (two) times daily. 12/13/18   Raylene Everts, MD  fluticasone [facility]) 50 MCG/ACT nasal spray Place 1 spray into both nostrils daily. 11/04/18   Petrucelli, Glynda Jaeger, PA-C    Family History History reviewed. No pertinent family history. Says parents are healthy Social History Social History   Tobacco Use  . Smoking  status: Never Smoker  . Smokeless tobacco: Never Used  Substance Use Topics  . Alcohol use: Not Currently  . Drug use: Not Currently     Allergies   Patient has no known allergies.   Review of Systems Review of Systems  Constitutional: Negative for chills and fever.  HENT: Positive for congestion, rhinorrhea and sinus pressure. Negative for ear pain and sore throat.   Eyes: Negative for pain and visual disturbance.  Respiratory: Negative for cough and shortness of breath.   Cardiovascular: Negative for chest pain and palpitations.  Gastrointestinal: Negative for abdominal pain and vomiting.  Genitourinary: Negative for dysuria and hematuria.  Musculoskeletal: Negative for arthralgias and back pain.  Skin: Negative for color change and rash.  Neurological: Positive for weakness and headaches. Negative for seizures and syncope.  All other systems reviewed and are negative.    Physical Exam Triage Vital Signs ED Triage Vitals  Enc Vitals Group     BP 12/13/18 1404 126/66     Pulse Rate 12/13/18 1404 86     Resp 12/13/18 1440 20     Temp 12/13/18 1440 98.5 F (36.9 C)     Temp Source 12/13/18 1404 Oral     SpO2 12/13/18 1404 100 %   No data found.  Updated Vital Signs BP 108/72   Pulse 78   Temp 98.5 F (36.9 C)   Resp 20  LMP 11/17/2018 (Approximate)   SpO2 100%  :     Physical Exam Constitutional:      General: She is not in acute distress.    Appearance: She is well-developed and normal weight. She is not ill-appearing.  HENT:     Head: Normocephalic and atraumatic.     Right Ear: Tympanic membrane, ear canal and external ear normal.     Left Ear: Tympanic membrane, ear canal and external ear normal.     Nose: Congestion present.     Comments: Mild nasal congestion.  Clear rhinorrhea.  No sinus tenderness    Mouth/Throat:     Pharynx: No posterior oropharyngeal erythema.  Eyes:     Conjunctiva/sclera: Conjunctivae normal.     Pupils: Pupils are equal,  round, and reactive to light.  Neck:     Musculoskeletal: Normal range of motion.  Cardiovascular:     Rate and Rhythm: Normal rate and regular rhythm.     Heart sounds: Normal heart sounds.  Pulmonary:     Effort: Pulmonary effort is normal. No respiratory distress.     Breath sounds: Normal breath sounds.     Comments: Lungs are clear Abdominal:     General: There is no distension.     Palpations: Abdomen is soft.  Musculoskeletal: Normal range of motion.  Lymphadenopathy:     Cervical: No cervical adenopathy.  Skin:    General: Skin is warm and dry.  Neurological:     Mental Status: She is alert.  Psychiatric:        Mood and Affect: Mood normal.      UC Treatments / Results  Labs (all labs ordered are listed, but only abnormal results are displayed) Labs Reviewed - No data to display  EKG None  Radiology No results found.  Procedures Procedures (including critical care time)  Medications Ordered in UC Medications - No data to display  Initial Impression / Assessment and Plan / UC Course  I have reviewed the triage vital signs and the nursing notes.  Pertinent labs & imaging results that were available during my care of the patient were reviewed by me and considered in my medical decision making (see chart for details).     Patient has nonspecific symptoms.  Could be allergies.  Doubt it is a viral illness given the longevity.  Her COVID-19 is negative.  Her physical examination has noncontributory.  Mild rhinorrhea.  I told her we did treat her with allergy medicine.  Refer her to a primary care doctor for more of a checkup, work-up for weakness.  Did not see any reason she cannot work with her symptoms.  I am releasing her back to work after [employer]. Final Clinical Impressions(s) / UC Diagnoses   Final diagnoses:  Environmental allergies  Weakness generalized     Discharge Instructions     Drink plenty of water Take the allergy medicine  every day  See a primary care doctor for follow and check up   ED Prescriptions    Medication Sig Dispense Auth. Provider   cetirizine-pseudoephedrine (ZYRTEC-D) 5-120 MG tablet Take 1 tablet by mouth 2 (two) times daily. 60 tablet Raylene Everts, MD     Controlled Substance Prescriptions [place] Controlled Substance Registry consulted? Not Applicable   Raylene Everts, MD 12/13/18 1455

## 2019-01-03 DIAGNOSIS — Z789 Other specified health status: Secondary | ICD-10-CM | POA: Insufficient documentation

## 2020-02-07 ENCOUNTER — Other Ambulatory Visit: Payer: Self-pay

## 2020-02-07 ENCOUNTER — Ambulatory Visit (INDEPENDENT_AMBULATORY_CARE_PROVIDER_SITE_OTHER): Payer: Self-pay | Admitting: *Deleted

## 2020-02-07 DIAGNOSIS — Z3201 Encounter for pregnancy test, result positive: Secondary | ICD-10-CM

## 2020-02-07 DIAGNOSIS — Z3200 Encounter for pregnancy test, result unknown: Secondary | ICD-10-CM

## 2020-02-07 LAB — POCT PREGNANCY, URINE: Preg Test, Ur: POSITIVE — AB

## 2020-02-07 NOTE — Progress Notes (Addendum)
Pt submitted urine specimen for pregnancy test. I called her with [facility] interpreter [facility] # [phone] and informed her of +UPT. Pt expressed happiness and stated this is her first pregnancy. She reports sure LMP 11/09/19 which would yield EDD 08/15/20. Now [redacted]w[redacted]d.  She denied having vaginal bleeding or abdominal pain. Pt was advised to go to [facility] if these sx occur. Pt was also instructed to begin taking prenatal vitamins. She will be scheduled for prenatal care in this office and someone will call her with appointment information. Pt vocied understanding of all information and instructions given.    Chart reviewed for nurse visit. Agree with plan of care.   Virginia Rochester, NP 02/07/2020 1:01 PM

## 2020-02-27 ENCOUNTER — Other Ambulatory Visit: Payer: Self-pay

## 2020-02-27 ENCOUNTER — Inpatient Hospital Stay (HOSPITAL_COMMUNITY)
Admission: AD | Admit: 2020-02-27 | Discharge: 2020-02-27 | Disposition: A | Discharge status: Home / Self Care | Payer: Self-pay | Attending: Obstetrics & Gynecology | Admitting: Obstetrics & Gynecology

## 2020-02-27 ENCOUNTER — Ambulatory Visit (HOSPITAL_COMMUNITY)
Admission: EM | Admit: 2020-02-27 | Discharge: 2020-02-27 | Disposition: A | Discharge status: Home / Self Care | Payer: Self-pay

## 2020-02-27 ENCOUNTER — Encounter (HOSPITAL_COMMUNITY): Payer: Self-pay | Admitting: Obstetrics & Gynecology

## 2020-02-27 ENCOUNTER — Inpatient Hospital Stay (HOSPITAL_COMMUNITY): Payer: Self-pay

## 2020-02-27 DIAGNOSIS — O3413 Maternal care for benign tumor of corpus uteri, third trimester: Secondary | ICD-10-CM

## 2020-02-27 DIAGNOSIS — O3412 Maternal care for benign tumor of corpus uteri, second trimester: Secondary | ICD-10-CM

## 2020-02-27 DIAGNOSIS — O26899 Other specified pregnancy related conditions, unspecified trimester: Secondary | ICD-10-CM

## 2020-02-27 DIAGNOSIS — O26892 Other specified pregnancy related conditions, second trimester: Secondary | ICD-10-CM

## 2020-02-27 DIAGNOSIS — Z3490 Encounter for supervision of normal pregnancy, unspecified, unspecified trimester: Secondary | ICD-10-CM

## 2020-02-27 DIAGNOSIS — O209 Hemorrhage in early pregnancy, unspecified: Secondary | ICD-10-CM

## 2020-02-27 DIAGNOSIS — D259 Leiomyoma of uterus, unspecified: Secondary | ICD-10-CM

## 2020-02-27 DIAGNOSIS — Z3A15 15 weeks gestation of pregnancy: Secondary | ICD-10-CM

## 2020-02-27 DIAGNOSIS — R109 Unspecified abdominal pain: Secondary | ICD-10-CM

## 2020-02-27 HISTORY — DX: Leiomyoma of uterus, unspecified: O34.13

## 2020-02-27 LAB — URINALYSIS, ROUTINE W REFLEX MICROSCOPIC
Bacteria, UA: NONE SEEN
Bilirubin Urine: NEGATIVE
Glucose, UA: NEGATIVE mg/dL
Ketones, ur: NEGATIVE mg/dL
Leukocytes,Ua: NEGATIVE
Nitrite: NEGATIVE
Protein, ur: 30 mg/dL — AB
RBC / HPF: >50 RBC/hpf — ABNORMAL HIGH (ref 0–5)
Specific Gravity, Urine: 1.017 (ref 1.005–1.030)
pH: 8 (ref 5.0–8.0)

## 2020-02-27 LAB — COMPREHENSIVE METABOLIC PANEL
ALT: 11 U/L (ref 0–44)
AST: 22 U/L (ref 15–41)
Albumin: 3.1 g/dL — ABNORMAL LOW (ref 3.5–5.0)
Alkaline Phosphatase: 39 U/L (ref 38–126)
Anion gap: 11 (ref 5–15)
BUN: <5 mg/dL — ABNORMAL LOW (ref 6–20)
CO2: 20 mmol/L — ABNORMAL LOW (ref 22–32)
Calcium: 9.2 mg/dL (ref 8.9–10.3)
Chloride: 105 mmol/L (ref 98–111)
Creatinine, Ser: 0.64 mg/dL (ref 0.44–1.00)
GFR calc Af Amer: >60 mL/min (ref >60)
GFR calc non Af Amer: >60 mL/min (ref >60)
Glucose, Bld: 89 mg/dL (ref 70–99)
Potassium: 4.3 mmol/L (ref 3.5–5.1)
Sodium: 136 mmol/L (ref 135–145)
Total Bilirubin: 1.1 mg/dL (ref 0.3–1.2)
Total Protein: 6.2 g/dL — ABNORMAL LOW (ref 6.5–8.1)

## 2020-02-27 LAB — CBC
HCT: 36.6 % (ref 36.0–46.0)
Hemoglobin: 11.8 g/dL — ABNORMAL LOW (ref 12.0–15.0)
MCH: 27.6 pg (ref 26.0–34.0)
MCHC: 32.2 g/dL (ref 30.0–36.0)
MCV: 85.7 fL (ref 80.0–100.0)
Platelets: 230 10*3/uL (ref 150–400)
RBC: 4.27 MIL/uL (ref 3.87–5.11)
RDW: 14 % (ref 11.5–15.5)
WBC: 7.4 10*3/uL (ref 4.0–10.5)
nRBC: 0 % (ref 0.0–0.2)

## 2020-02-27 LAB — HCG, QUANTITATIVE, PREGNANCY: hCG, Beta Chain, Quant, S: 20351 m[IU]/mL — ABNORMAL HIGH (ref <5)

## 2020-02-27 LAB — WET PREP, GENITAL
Clue Cells Wet Prep HPF POC: NONE SEEN
Sperm: NONE SEEN
Trich, Wet Prep: NONE SEEN
Yeast Wet Prep HPF POC: NONE SEEN

## 2020-02-27 LAB — ABO/RH: ABO/RH(D): B POS

## 2020-02-27 NOTE — MAU Provider Note (Signed)
[facility]     CSN: [id number]  Arrival date and time: 02/27/20 1157  First Provider Initiated Contact with Patient 02/27/20 1236    Chief Complaint  Patient presents with  . Abdominal Pain   HPI Nicole Lindsey is a 34 y.o. G1P0 in early pregnancy who presents to MAU with chief complaint of abdominal pain. Patient's pain started Friday 02/25/20 then resolved without intervention. Her pain started again Saturday and is aggravated by walking, prolonged standing and repositioning. She has not taken medication or tried other treatments for this complaint. She endorses seeing a spot of blood on her toilet paper when she wiped after voiding in MAU, otherwise no bleeding. She is remote from sexual intercourse.  Patient is planning prenatal care with [facility] and has a New OB appointment scheduled for 03/06/2020.  OB History    Gravida  1   Para      Term      Preterm      AB      Living        SAB      TAB      Ectopic      Multiple      Live Births              No past medical history on file.  No past surgical history on file.  No family history on file.  Social History   Tobacco Use  . Smoking status: Never Smoker  . Smokeless tobacco: Never Used  Substance Use Topics  . Alcohol use: Not Currently  . Drug use: Not Currently    Allergies: No Known Allergies  Medications Prior to Admission  Medication Sig Dispense Refill Last Dose  . acetaminophen (TYLENOL 8 HOUR) 650 MG CR tablet Take 1 tablet (650 mg total) by mouth every 8 (eight) hours as needed for pain. 15 tablet 0   . cetirizine-pseudoephedrine (ZYRTEC-D) 5-120 MG tablet Take 1 tablet by mouth 2 (two) times daily. 60 tablet 0   . fluticasone (FLONASE) 50 MCG/ACT nasal spray Place 1 spray into both nostrils daily. 16 g 0     Review of Systems  Gastrointestinal: Positive for abdominal pain.  Genitourinary: Negative for vaginal bleeding, vaginal discharge and vaginal pain.   Musculoskeletal: Negative for back pain.  All other systems reviewed and are negative.  Physical Exam   Blood pressure 116/62, pulse 79, temperature 98.3 F (36.8 C), temperature source Oral, resp. rate 16, last menstrual period 11/09/2019, SpO2 100 %.  Physical Exam Vitals and nursing note reviewed. Exam conducted with a chaperone present.  Cardiovascular:     Rate and Rhythm: Normal rate.     Heart sounds: Normal heart sounds.  Pulmonary:     Effort: Pulmonary effort is normal.     Breath sounds: Normal breath sounds.  Abdominal:     General: Bowel sounds are normal.     Tenderness: There is no abdominal tenderness. There is no right CVA tenderness or left CVA tenderness.     Comments: Gravid not c/w dating  Skin:    General: Skin is warm and dry.     Capillary Refill: Capillary refill takes less than 2 seconds.  Neurological:     General: No focal deficit present.     Mental Status: She is alert.  Psychiatric:        Mood and Affect: Mood normal.        Behavior: Behavior normal.     MAU Course  Procedures  --Pelvic exam declined by patient  Patient Vitals for the past 24 hrs:  BP Temp Temp src Pulse Resp SpO2  02/27/20 1524 (!) 113/54 -- -- 68 -- --  02/27/20 1227 116/62 98.3 F (36.8 C) Oral 79 16 100 %   Results for orders placed or performed during the hospital encounter of 02/27/20 (from the past 24 hour(s))  Wet prep, genital     Status: Abnormal   Collection Time: 02/27/20 12:45 PM   Specimen: Vaginal  Result Value Ref Range   Yeast Wet Prep HPF POC NONE SEEN NONE SEEN   Trich, Wet Prep NONE SEEN NONE SEEN   Clue Cells Wet Prep HPF POC NONE SEEN NONE SEEN   WBC, Wet Prep HPF POC MODERATE (A) NONE SEEN   Sperm NONE SEEN   CBC     Status: Abnormal   Collection Time: 02/27/20  1:11 PM  Result Value Ref Range   WBC 7.4 4.0 - 10.5 K/uL   RBC 4.27 3.87 - 5.11 MIL/uL   Hemoglobin 11.8 (L) 12.0 - 15.0 g/dL   HCT 36.6 36 - 46 %   MCV 85.7 80.0 - 100.0 fL    MCH 27.6 26.0 - 34.0 pg   MCHC 32.2 30.0 - 36.0 g/dL   RDW 14.0 11.5 - 15.5 %   Platelets 230 150 - 400 K/uL   nRBC 0.0 0.0 - 0.2 %  ABO/Rh     Status: None   Collection Time: 02/27/20  1:11 PM  Result Value Ref Range   ABO/RH(D) B POS    No rh immune globuloin      NOT A RH IMMUNE GLOBULIN CANDIDATE, PT RH POSITIVE Performed at [facility], [place] [address], [place], [place] [address]   hCG, quantitative, pregnancy     Status: Abnormal   Collection Time: 02/27/20  1:11 PM  Result Value Ref Range   hCG, Beta Chain, Quant, S 20,351 (H) <5 mIU/mL  Comprehensive metabolic panel     Status: Abnormal   Collection Time: 02/27/20  1:11 PM  Result Value Ref Range   Sodium 136 135 - 145 mmol/L   Potassium 4.3 3.5 - 5.1 mmol/L   Chloride 105 98 - 111 mmol/L   CO2 20 (L) 22 - 32 mmol/L   Glucose, Bld 89 70 - 99 mg/dL   BUN <5 (L) 6 - 20 mg/dL   Creatinine, Ser 0.64 0.44 - 1.00 mg/dL   Calcium 9.2 8.9 - 10.3 mg/dL   Total Protein 6.2 (L) 6.5 - 8.1 g/dL   Albumin 3.1 (L) 3.5 - 5.0 g/dL   AST 22 15 - 41 U/L   ALT 11 0 - 44 U/L   Alkaline Phosphatase 39 38 - 126 U/L   Total Bilirubin 1.1 0.3 - 1.2 mg/dL   GFR calc non Af Amer >60 >60 mL/min   GFR calc Af Amer >60 >60 mL/min   Anion gap 11 5 - 15  Urinalysis, Routine w reflex microscopic Urine, Clean Catch     Status: Abnormal   Collection Time: 02/27/20  1:47 PM  Result Value Ref Range   Color, Urine AMBER (A) YELLOW   APPearance HAZY (A) CLEAR   Specific Gravity, Urine 1.017 1.005 - 1.030   pH 8.0 5.0 - 8.0   Glucose, UA NEGATIVE NEGATIVE mg/dL   Hgb urine dipstick LARGE (A) NEGATIVE   Bilirubin Urine NEGATIVE NEGATIVE   Ketones, ur NEGATIVE NEGATIVE mg/dL   Protein, ur 30 (A)  NEGATIVE mg/dL   Nitrite NEGATIVE NEGATIVE   Leukocytes,Ua NEGATIVE NEGATIVE   RBC / HPF >50 (H) 0 - 5 RBC/hpf   WBC, UA 0-5 0 - 5 WBC/hpf   Bacteria, UA NONE SEEN NONE SEEN   Squamous Epithelial / LPF 0-5 0 - 5   Mucus PRESENT    Assessment  and Plan  --34 y.o. G1P0 at [redacted]w[redacted]d  --No concerning findings on MFM scan today --Blood type B POS --Multiple uterine fibroids (see MFM report in Media tab) --Language barrier: remote [place] interpreter utilized for all patient interaction --Discharge home in stable condition  F/U: --New OB [facility] 03/06/2020  Darlina Rumpf, [place] 02/27/2020, 4:04 PM

## 2020-02-27 NOTE — Discharge Instructions (Signed)
Activity Restriction During Pregnancy Your health care provider may recommend specific activity restrictions during pregnancy for a variety of reasons. Activity restriction may require that you limit activities that require great effort, such as exercise, lifting, or sex. The type of activity restriction will vary for each person, depending on your risk or the problems you are having. Activity restriction may be recommended for a period of time until your baby is delivered. Why are activity restrictions recommended? Activity restriction may be recommended if:  Your placenta is partially or completely covering the opening of your cervix (placenta previa).  There is bleeding between the wall of the uterus and the amniotic sac in the first trimester of pregnancy (subchorionic hemorrhage).  You went into labor too early (preterm labor).  You have a history of miscarriage.  You have a condition that causes high blood pressure during pregnancy (preeclampsia or eclampsia).  You are pregnant with more than one baby.  Your baby is not growing well. What are the risks? The risks depend on your specific restriction. Strict bed rest has the most physical and emotional risks and is no longer routinely recommended. Risks of strict bed rest include:  Loss of muscle conditioning from not moving.  Blood clots.  Social isolation.  Depression.  Loss of income. Talk with your health care team about activity restriction to decide if it is best for you and your baby. Even if you are having problems during your pregnancy, you may be able to continue with normal levels of activity with careful monitoring by your health care team. Follow these instructions at home: If needed, based on your overall health and the health of your baby, your health care provider will decide which type of activity restriction is right for you. Activity restrictions may include:  Not lifting anything heavier than 10 pounds (4.5  kg).  Avoiding activities that take a lot of physical effort.  No lifting or straining.  Resting in a sitting position or lying down for periods of time during the day. Pelvic rest may be recommended along with activity restrictions. If pelvic rest is recommended, then:  Do not have sex, an orgasm, or use sexual stimulation.  Do not use tampons. Do not douche. Do not put anything into your vagina.  Do not lift anything that is heavier than 10 lb (4.5 kg).  Avoid activities that require a lot of effort.  Avoid any activity in which your pelvic muscles could become strained, such as squatting. Questions to ask your health care provider  Why is my activity being limited?  How will activity restrictions affect my body?  Why is rest helpful for me and my baby?  What activities can I do?  When can I return to normal activities? When should I seek immediate medical care? Seek immediate medical care if you have:  Vaginal bleeding.  Vaginal discharge.  Cramping pain in your lower abdomen.  Regular contractions.  A low, dull backache. Summary  Your health care provider may recommend specific activity restrictions during pregnancy for a variety of reasons.  Activity restriction may require that you limit activities such as exercise, lifting, sex, or any other activity that requires great effort.  Discuss the risks and benefits of activity restriction with your health care team to decide if it is best for you and your baby.  Contact your health care provider right away if you think you are having contractions, or if you notice vaginal bleeding, discharge, or cramping. This information is not   intended to replace advice given to you by your health care provider. Make sure you discuss any questions you have with your health care provider. Document Revised: 03/31/2019 Document Reviewed: 10/28/2017 Elsevier Patient Education  2020 Elsevier Inc[employer]  

## 2020-02-27 NOTE — MAU Note (Addendum)
Pt reports to MAU with c/o generalized abd pain that she describes as a sharp pain that comes and goes but is worse when she is moving. Pt denies vag bleeding at home but now sees some in mau in underwear.

## 2020-02-28 LAB — GC/CHLAMYDIA PROBE AMP (~~LOC~~) NOT AT ARMC
Chlamydia: NEGATIVE
Comment: NEGATIVE
Comment: NORMAL
Neisseria Gonorrhea: NEGATIVE

## 2020-03-06 ENCOUNTER — Other Ambulatory Visit: Payer: Self-pay

## 2020-03-06 ENCOUNTER — Encounter: Payer: Self-pay | Admitting: Family Medicine

## 2020-03-06 ENCOUNTER — Encounter: Payer: Self-pay | Admitting: Nurse Practitioner

## 2020-03-06 ENCOUNTER — Ambulatory Visit (INDEPENDENT_AMBULATORY_CARE_PROVIDER_SITE_OTHER): Payer: Self-pay | Admitting: Nurse Practitioner

## 2020-03-06 ENCOUNTER — Other Ambulatory Visit (HOSPITAL_COMMUNITY)
Admission: RE | Admit: 2020-03-06 | Discharge: 2020-03-06 | Disposition: A | Discharge status: Home / Self Care | Payer: Self-pay | Source: Ambulatory Visit | Attending: Nurse Practitioner | Admitting: Nurse Practitioner

## 2020-03-06 VITALS — BP 118/55 | HR 85 | Ht 69.0 in | Wt 188.6 lb

## 2020-03-06 DIAGNOSIS — Z3402 Encounter for supervision of normal first pregnancy, second trimester: Secondary | ICD-10-CM | POA: Insufficient documentation

## 2020-03-06 DIAGNOSIS — Z3A16 16 weeks gestation of pregnancy: Secondary | ICD-10-CM

## 2020-03-06 DIAGNOSIS — O3412 Maternal care for benign tumor of corpus uteri, second trimester: Secondary | ICD-10-CM

## 2020-03-06 DIAGNOSIS — O0990 Supervision of high risk pregnancy, unspecified, unspecified trimester: Secondary | ICD-10-CM

## 2020-03-06 DIAGNOSIS — D259 Leiomyoma of uterus, unspecified: Secondary | ICD-10-CM

## 2020-03-06 DIAGNOSIS — Z789 Other specified health status: Secondary | ICD-10-CM

## 2020-03-06 DIAGNOSIS — O09519 Supervision of elderly primigravida, unspecified trimester: Secondary | ICD-10-CM | POA: Insufficient documentation

## 2020-03-06 DIAGNOSIS — O09512 Supervision of elderly primigravida, second trimester: Secondary | ICD-10-CM

## 2020-03-06 NOTE — Progress Notes (Signed)
Subjective:   Nicole Lindsey is a 34 y.o. G1P0000 at [redacted]w[redacted]d by LMP being seen today for her first obstetrical visit.  Her obstetrical history is significant for advanced maternal age, first pregnancy, nonEnglish speaking and uterine fibroids. Patient does intend to breast feed. Pregnancy history fully reviewed.  Patient reports periodic cramping. Likely is from uterine fibroids and has recently starting taking tylenol for the cramping which has helped.  Has been out of work for one week - will give note to return to work.  HISTORY: OB History  Gravida Para Term Preterm AB Living  1 0 0 0 0 0  SAB TAB Ectopic Multiple Live Births  0 0 0 0 0    # Outcome Date GA Lbr Len/2nd Weight Sex Delivery Anes PTL Lv  1 Current            Past Medical History:  Diagnosis Date  . Medical history non-contributory    History reviewed. No pertinent surgical history. History reviewed. No pertinent family history. Social History   Tobacco Use  . Smoking status: Never Smoker  . Smokeless tobacco: Never Used  Substance Use Topics  . Alcohol use: Not Currently  . Drug use: Not Currently   No Known Allergies Current Outpatient Medications on File Prior to Visit  Medication Sig Dispense Refill  . Prenatal Vit-Fe Fumarate-FA (MULTIVITAMIN-PRENATAL) 27-0.8 MG TABS tablet Take 1 tablet by mouth daily at 12 noon.    Marland Kitchen acetaminophen (TYLENOL 8 HOUR) 650 MG CR tablet Take 1 tablet (650 mg total) by mouth every 8 (eight) hours as needed for pain. (Patient not taking: Reported on 03/06/2020) 15 tablet 0  . cetirizine-pseudoephedrine (ZYRTEC-D) 5-120 MG tablet Take 1 tablet by mouth 2 (two) times daily. (Patient not taking: Reported on 03/06/2020) 60 tablet 0  . fluticasone (FLONASE) 50 MCG/ACT nasal spray Place 1 spray into both nostrils daily. (Patient not taking: Reported on 03/06/2020) 16 g 0   No current facility-administered medications on file prior to visit.     Exam   Vitals:   03/06/20 1450  03/06/20 1458  BP: (!) 118/55   Pulse: 85   Weight: 188 lb 9.6 oz (85.5 kg)   Height:  5\' 9"  (1.753 m)   Fetal Heart Rate (bpm): 149  Uterus:     Pelvic Exam: Perineum: Pelvic deferred   Vulva:    Vagina:     Cervix:    Adnexa:    Bony Pelvis:   System: General: well-developed, well-nourished female in no acute distress   Breast:  deferred   Skin: normal coloration and turgor, no rashes   Neurologic: oriented, normal, negative, normal mood   Extremities: normal strength, tone, and muscle mass, ROM of all joints is normal   HEENT extraocular movement intact and sclera clear, anicteric   Mouth/Teeth deferred   Neck supple and no masses, normal thyroid   Cardiovascular: regular rate and rhythm   Respiratory:  no respiratory distress, normal breath sounds   Abdomen: soft, non-tender; no masses,  no organomegaly     Assessment:   Pregnancy: G1P0000 Patient Active Problem List   Diagnosis Date Noted  . Supervision of low-risk first pregnancy 03/06/2020  . Advanced maternal age, 1st pregnancy 03/06/2020  . Uterine fibroids affecting pregnancy in second trimester 02/27/2020  . Non-English speaking patient 01/03/2019     Plan:  1. Encounter for supervision of low-risk first pregnancy in second trimester Doing well, no edema - Genetic Screening - Culture, OB Urine -  CBC/D/Plt+RPR+Rh+ABO+Rub Ab... - Hemoglobin A1c - GC/Chlamydia probe amp ([place])not at [facility] - [place] MFM OB DETAIL +14 WK; Future  2. Primigravida of advanced maternal age in second trimester  3. Non-English speaking patient In person interpreter accompanies her to the visit today Has very few friends and no family locally - has been in [place] for 2 years  4. [redacted] weeks gestation of pregnancy  5. Uterine fibroids affecting pregnancy in second trimester Identified on ultrasound when went to MAU this month   Initial labs drawn. Continue prenatal vitamins. Genetic Screening discussed, NIPS:  ordered. Ultrasound discussed; fetal anatomic survey: ordered. Problem list reviewed and updated. The nature of [facility] with multiple MDs and other Advanced Practice Providers was explained to patient; also emphasized that residents, students are part of our team. Routine obstetric precautions reviewed. Return in about 4 weeks (around 04/03/2020) for for in person ROB .  Total face-to-face time with patient: 40 minutes.  Over 50% of encounter was spent on counseling and coordination of care.     Earlie Server, FNP Family Nurse Practitioner, [facility] for [employer], [facility] 03/06/2020 3:43 PM

## 2020-03-07 LAB — CBC/D/PLT+RPR+RH+ABO+RUB AB...
Antibody Screen: NEGATIVE
Basophils Absolute: 0 10*3/uL (ref 0.0–0.2)
Basos: 0 %
EOS (ABSOLUTE): 0.2 10*3/uL (ref 0.0–0.4)
Eos: 2 %
HCV Ab: <0.1 s/co ratio (ref 0.0–0.9)
HIV Screen 4th Generation wRfx: NONREACTIVE
Hematocrit: 33.3 % — ABNORMAL LOW (ref 34.0–46.6)
Hemoglobin: 10.5 g/dL — ABNORMAL LOW (ref 11.1–15.9)
Hepatitis B Surface Ag: NEGATIVE
Immature Grans (Abs): 0 10*3/uL (ref 0.0–0.1)
Immature Granulocytes: 0 %
Lymphocytes Absolute: 1.3 10*3/uL (ref 0.7–3.1)
Lymphs: 19 %
MCH: 27.3 pg (ref 26.6–33.0)
MCHC: 31.5 g/dL (ref 31.5–35.7)
MCV: 87 fL (ref 79–97)
Monocytes Absolute: 0.6 10*3/uL (ref 0.1–0.9)
Monocytes: 8 %
Neutrophils Absolute: 4.7 10*3/uL (ref 1.4–7.0)
Neutrophils: 71 %
Platelets: 266 10*3/uL (ref 150–450)
RBC: 3.84 x10E6/uL (ref 3.77–5.28)
RDW: 14 % (ref 11.7–15.4)
RPR Ser Ql: NONREACTIVE
Rh Factor: POSITIVE
Rubella Antibodies, IGG: 15.7 index (ref >0.99)
WBC: 6.8 10*3/uL (ref 3.4–10.8)

## 2020-03-07 LAB — HEMOGLOBIN A1C
Est. average glucose Bld gHb Est-mCnc: 105 mg/dL
Hgb A1c MFr Bld: 5.3 % (ref 4.8–5.6)

## 2020-03-07 LAB — HCV INTERPRETATION

## 2020-03-08 ENCOUNTER — Encounter: Payer: Self-pay | Admitting: General Practice

## 2020-03-08 DIAGNOSIS — O0990 Supervision of high risk pregnancy, unspecified, unspecified trimester: Secondary | ICD-10-CM

## 2020-03-08 LAB — GC/CHLAMYDIA PROBE AMP (~~LOC~~) NOT AT ARMC
Chlamydia: NEGATIVE
Comment: NEGATIVE
Comment: NORMAL
Neisseria Gonorrhea: NEGATIVE

## 2020-03-09 LAB — CULTURE, OB URINE

## 2020-03-09 LAB — URINE CULTURE, OB REFLEX

## 2020-03-22 ENCOUNTER — Ambulatory Visit: Payer: BC Managed Care – PPO | Attending: Nurse Practitioner

## 2020-03-22 ENCOUNTER — Other Ambulatory Visit: Payer: Self-pay | Admitting: *Deleted

## 2020-03-22 ENCOUNTER — Other Ambulatory Visit: Payer: Self-pay

## 2020-03-22 DIAGNOSIS — Z3402 Encounter for supervision of normal first pregnancy, second trimester: Secondary | ICD-10-CM | POA: Diagnosis present

## 2020-03-22 DIAGNOSIS — O09519 Supervision of elderly primigravida, unspecified trimester: Secondary | ICD-10-CM

## 2020-03-23 ENCOUNTER — Encounter: Payer: Self-pay | Admitting: General Practice

## 2020-04-06 ENCOUNTER — Encounter: Payer: Self-pay | Admitting: Student

## 2020-04-06 ENCOUNTER — Ambulatory Visit (INDEPENDENT_AMBULATORY_CARE_PROVIDER_SITE_OTHER): Payer: Self-pay | Admitting: Student

## 2020-04-06 VITALS — BP 110/67 | HR 70 | Wt 198.2 lb

## 2020-04-06 DIAGNOSIS — Z789 Other specified health status: Secondary | ICD-10-CM

## 2020-04-06 DIAGNOSIS — Z3A19 19 weeks gestation of pregnancy: Secondary | ICD-10-CM

## 2020-04-06 DIAGNOSIS — Z23 Encounter for immunization: Secondary | ICD-10-CM

## 2020-04-06 DIAGNOSIS — O0990 Supervision of high risk pregnancy, unspecified, unspecified trimester: Secondary | ICD-10-CM

## 2020-04-06 DIAGNOSIS — O3412 Maternal care for benign tumor of corpus uteri, second trimester: Secondary | ICD-10-CM

## 2020-04-06 DIAGNOSIS — D259 Leiomyoma of uterus, unspecified: Secondary | ICD-10-CM

## 2020-04-06 NOTE — Patient Instructions (Addendum)
The Maternity Assessment Unit (MAU) is located at the [facility] and [place] at [facility]. The address is: [address] [place], [place], [place] [address]. Please see map below for additional directions.    The Maternity Assessment Unit is designed to help you during your pregnancy, and for up to 6 weeks after delivery, with any pregnancy- or postpartum-related emergencies, if you think you are in labor, or if your water has broken. For example, if you experience nausea and vomiting, vaginal bleeding, severe abdominal or pelvic pain, elevated blood pressure or other problems related to your pregnancy or postpartum time, please come to the Maternity Assessment Unit for assistance.    Uterine Fibroids  Uterine fibroids (leiomyomas) are noncancerous (benign) tumors that can develop in the uterus. Fibroids may also develop in the fallopian tubes, cervix, or tissues (ligaments) near the uterus. You may have one or many fibroids. Fibroids vary in size, weight, and where they grow in the uterus. Some can become quite large. Most fibroids do not require medical treatment. What are the causes? The cause of this condition is not known. What increases the risk? You are more likely to develop this condition if you:  Are in your 30s or 40s and have not gone through menopause.  Have a family history of this condition.  Are of African-American descent.  Had your first period at an early age (early menarche).  Have not had any children (nulliparity).  Are overweight or obese. What are the signs or symptoms? Many women do not have any symptoms. Symptoms of this condition may include:  Heavy menstrual bleeding.  Bleeding or spotting between periods.  Pain and pressure in the pelvic area, between the hips.  Bladder problems, such as needing to urinate urgently or more often than usual.  Inability to have children (infertility).  Failure to carry pregnancy to term  (miscarriage). How is this diagnosed? This condition may be diagnosed based on:  Your symptoms and medical history.  A physical exam.  A pelvic exam that includes feeling for any tumors.  Imaging tests, such as ultrasound or MRI. How is this treated? Treatment for this condition may include:  Seeing your health care provider for follow-up visits to monitor your fibroids for any changes.  Taking NSAIDs such as ibuprofen, naproxen, or aspirin to reduce pain.  Hormone medicines. These may be taken as a pill, given in an injection, or delivered by a T-shaped device that is inserted into the uterus (intrauterine device, IUD).  Surgery to remove one of the following: ? The fibroids (myomectomy). Your health care provider may recommend this if fibroids affect your fertility and you want to become pregnant. ? The uterus (hysterectomy). ? Blood supply to the fibroids (uterine artery embolization). Follow these instructions at home:  Take over-the-counter and prescription medicines only as told by your health care provider.  Ask your health care provider if you should take iron pills or eat more iron-rich foods, such as dark green, leafy vegetables. Heavy menstrual bleeding can cause low iron levels.  If directed, apply heat to your back or abdomen to reduce pain. Use the heat source that your health care provider recommends, such as a moist heat pack or a heating pad. ? Place a towel between your skin and the heat source. ? Leave the heat on for 20-30 minutes. ? Remove the heat if your skin turns bright red. This is especially important if you are unable to feel pain, heat, or cold. You  may have a greater risk of getting burned.  Pay close attention to your menstrual cycle. Tell your health care provider about any changes, such as: ? Increased blood flow that requires you to use more pads or tampons than usual. ? A change in the number of days that your period lasts. ? A change in  symptoms that are associated with your period, such as back pain or cramps in your abdomen.  Keep all follow-up visits as told by your health care provider. This is important, especially if your fibroids need to be monitored for any changes. Contact a health care provider if you:  Have pelvic pain, back pain, or cramps in your abdomen that do not get better with medicine or heat.  Develop new bleeding between periods.  Have increased bleeding during or between periods.  Feel unusually tired or weak.  Feel light-headed. Get help right away if you:  Faint.  Have pelvic pain that suddenly gets worse.  Have severe vaginal bleeding that soaks a tampon or pad in 30 minutes or less. Summary  Uterine fibroids are noncancerous (benign) tumors that can develop in the uterus.  The exact cause of this condition is not known.  Most fibroids do not require medical treatment unless they affect your ability to have children (fertility).  Contact a health care provider if you have pelvic pain, back pain, or cramps in your abdomen that do not get better with medicines.  Make sure you know what symptoms should cause you to get help right away. This information is not intended to replace advice given to you by your health care provider. Make sure you discuss any questions you have with your health care provider. Document Revised: 06/20/2017 Document Reviewed: 06/03/2017 Elsevier Patient Education  2020 [employer].

## 2020-04-06 NOTE — Progress Notes (Signed)
PRENATAL VISIT NOTE  Subjective:  Nicole Lindsey is a 34 y.o. G1P0000 at [redacted]w[redacted]d being seen today for ongoing prenatal care.  She is currently monitored for the following issues for this low-risk pregnancy and has Uterine fibroids affecting pregnancy in second trimester; Supervision of high risk pregnancy, antepartum; Advanced maternal age, 1st pregnancy; and Non-English speaking patient on their problem list.  Patient reports no complaints.  Contractions: Not present. Vag. Bleeding: None.  Movement: Absent. Denies leaking of fluid.   The following portions of the patient's history were reviewed and updated as appropriate: allergies, current medications, past family history, past medical history, past social history, past surgical history and problem list.   Objective:   Vitals:   04/06/20 1029  BP: 110/67  Pulse: 70  Weight: 198 lb 3.2 oz (89.9 kg)    Fetal Status: Fetal Heart Rate (bpm): 145 Fundal Height: 20 cm Movement: Absent     General:  Alert, oriented and cooperative. Patient is in no acute distress.  Skin: Skin is warm and dry. No rash noted.   Cardiovascular: Normal heart rate noted  Respiratory: Normal respiratory effort, no problems with respiration noted  Abdomen: Soft, gravid, appropriate for gestational age.  Pain/Pressure: Absent     Pelvic: Cervical exam deferred        Extremities: Normal range of motion.  Edema: None  Mental Status: Normal mood and affect. Normal behavior. Normal judgment and thought content.   Assessment and Plan:  Pregnancy: G1P0000 at [redacted]w[redacted]d 1. Supervision of high risk pregnancy, antepartum -pt states she has a work requirement for covid vaccine & was inquiring about it. Discussed recommendations. She does not want to receive it during pregnancy. She will find out what documentation is required for deferment.  -COVID-19 Vaccine Counseling: The patient was counseled on the potential benefits and lack of known risks of COVID vaccination, during  pregnancy and breastfeeding, during today's visit. The patient's questions and concerns were addressed today, including safety of the vaccination and potential side effects as they have been published by ACOG and SMFM. The patient has been informed that there have not been any documented vaccine related injuries, deaths or birth defects to infant or mom after receiving the COVID-19 vaccine to date. The patient has been made aware that although she is not at increased risk of contracting COVID-19 during pregnancy, she is at increased risk of developing severe disease and complications if she contracts COVID-19 while pregnant. All patient questions were addressed during our visit today. The patient is not planning to get vaccinated at this time.    - Flu Vaccine QUAD 36+ mos IM - AFP, Serum, Open Spina Bifida  2. Non-English speaking patient -Kinyarwanda interpreter in person for this encounter  3. [redacted] weeks gestation of pregnancy   4. Uterine fibroids affecting pregnancy in second trimester -Discussed fibroids seen on ultrasound. Patient was already aware of fibroids prior to pregnancy. Denies any pain. She has follow up ultrasounds scheduled.   Preterm labor symptoms and general obstetric precautions including but not limited to vaginal bleeding, contractions, leaking of fluid and fetal movement were reviewed in detail with the patient. Please refer to After Visit Summary for other counseling recommendations.   Return in about 4 weeks (around 05/04/2020) for Routine OB, in person.  Future Appointments  Date Time Provider [place]  04/20/2020 10:30 AM [facility] NURSE [facility]  04/20/2020 10:45 AM [facility] US6 [facility]  05/09/2020  8:15 AM Jorje Guild, NP [facility]  Jorje Guild, NP

## 2020-04-08 LAB — AFP, SERUM, OPEN SPINA BIFIDA
AFP MoM: 0.8
AFP Value: 50.9 ng/mL
Gest. Age on Collection Date: 21.3 weeks
Maternal Age At EDD: 35 yr
OSBR Risk 1 IN: 10000
Test Results:: NEGATIVE
Weight: 198 [lb_av]

## 2020-04-20 ENCOUNTER — Other Ambulatory Visit: Payer: Self-pay

## 2020-04-20 ENCOUNTER — Other Ambulatory Visit: Payer: Self-pay | Admitting: *Deleted

## 2020-04-20 ENCOUNTER — Ambulatory Visit: Payer: BC Managed Care – PPO | Admitting: *Deleted

## 2020-04-20 ENCOUNTER — Ambulatory Visit: Payer: BC Managed Care – PPO | Attending: Obstetrics and Gynecology

## 2020-04-20 DIAGNOSIS — D259 Leiomyoma of uterus, unspecified: Secondary | ICD-10-CM | POA: Diagnosis not present

## 2020-04-20 DIAGNOSIS — O09522 Supervision of elderly multigravida, second trimester: Secondary | ICD-10-CM

## 2020-04-20 DIAGNOSIS — O3412 Maternal care for benign tumor of corpus uteri, second trimester: Secondary | ICD-10-CM

## 2020-04-20 DIAGNOSIS — O321XX0 Maternal care for breech presentation, not applicable or unspecified: Secondary | ICD-10-CM

## 2020-04-20 DIAGNOSIS — O3410 Maternal care for benign tumor of corpus uteri, unspecified trimester: Secondary | ICD-10-CM

## 2020-04-20 DIAGNOSIS — O09512 Supervision of elderly primigravida, second trimester: Secondary | ICD-10-CM | POA: Diagnosis present

## 2020-04-20 DIAGNOSIS — O0990 Supervision of high risk pregnancy, unspecified, unspecified trimester: Secondary | ICD-10-CM | POA: Diagnosis present

## 2020-04-20 DIAGNOSIS — Z3A23 23 weeks gestation of pregnancy: Secondary | ICD-10-CM

## 2020-04-20 DIAGNOSIS — Z362 Encounter for other antenatal screening follow-up: Secondary | ICD-10-CM

## 2020-04-20 DIAGNOSIS — O09519 Supervision of elderly primigravida, unspecified trimester: Secondary | ICD-10-CM

## 2020-05-09 ENCOUNTER — Encounter: Payer: Self-pay | Admitting: Student

## 2020-05-09 ENCOUNTER — Ambulatory Visit (INDEPENDENT_AMBULATORY_CARE_PROVIDER_SITE_OTHER): Payer: BC Managed Care – PPO | Admitting: Student

## 2020-05-09 ENCOUNTER — Other Ambulatory Visit: Payer: Self-pay

## 2020-05-09 VITALS — BP 105/59 | HR 70 | Wt 198.9 lb

## 2020-05-09 DIAGNOSIS — Z789 Other specified health status: Secondary | ICD-10-CM

## 2020-05-09 DIAGNOSIS — O0990 Supervision of high risk pregnancy, unspecified, unspecified trimester: Secondary | ICD-10-CM

## 2020-05-09 DIAGNOSIS — O26892 Other specified pregnancy related conditions, second trimester: Secondary | ICD-10-CM

## 2020-05-09 DIAGNOSIS — R12 Heartburn: Secondary | ICD-10-CM

## 2020-05-09 DIAGNOSIS — Z3A26 26 weeks gestation of pregnancy: Secondary | ICD-10-CM

## 2020-05-09 MED ORDER — FAMOTIDINE 40 MG PO TABS: 40.0000 mg | ORAL_TABLET | Freq: Every day | ORAL | 0 refills | Status: DC

## 2020-05-09 NOTE — Progress Notes (Signed)
° °  PRENATAL VISIT NOTE  Subjective:  Nicole Lindsey is a 34 y.o. G1P0000 at [redacted]w[redacted]d being seen today for ongoing prenatal care.  She is currently monitored for the following issues for this high-risk pregnancy and has Uterine fibroids affecting pregnancy in second trimester; Supervision of high risk pregnancy, antepartum; Advanced maternal age, 1st pregnancy; and Non-English speaking patient on their problem list.  Patient reports abdominal pain. Reports intermittent abdominal cramping that is not regular in timing and does not occur daily. Also complains of daily epigastric pain that is worse on an empty stomach. Had this pain prior to pregnancy but it has recently gotten worse.   Contractions: Not present. Vag. Bleeding: None.  Movement: Present. Denies leaking of fluid.   The following portions of the patient's history were reviewed and updated as appropriate: allergies, current medications, past family history, past medical history, past social history, past surgical history and problem list.   Objective:   Vitals:   05/09/20 0826  BP: (!) 105/59  Pulse: 70  Weight: 198 lb 14.4 oz (90.2 kg)    Fetal Status: Fetal Heart Rate (bpm): 142 Fundal Height: 27 cm Movement: Present     General:  Alert, oriented and cooperative. Patient is in no acute distress.  Skin: Skin is warm and dry. No rash noted.   Cardiovascular: Normal heart rate noted  Respiratory: Normal respiratory effort, no problems with respiration noted  Abdomen: Soft, gravid, appropriate for gestational age.  Pain/Pressure: Absent     Pelvic: Cervical exam deferred        Extremities: Normal range of motion.  Edema: None  Mental Status: Normal mood and affect. Normal behavior. Normal judgment and thought content.   Assessment and Plan:  Pregnancy: G1P0000 at [redacted]w[redacted]d 1. Supervision of high risk pregnancy, antepartum -doing well. Discussed upcoming visit and diabetes screening  2. Heartburn during pregnancy in second  trimester -epigastric pain - will trial pepcid & reassess at next visit - famotidine (PEPCID) 40 MG tablet; Take 1 tablet (40 mg total) by mouth daily.  Dispense: 30 tablet; Refill: 0  3. Non-English speaking patient -Video Kinyarwanda interpreter used for this encounter  4. [redacted] weeks gestation of pregnancy   Preterm labor symptoms and general obstetric precautions including but not limited to vaginal bleeding, contractions, leaking of fluid and fetal movement were reviewed in detail with the patient. Please refer to After Visit Summary for other counseling recommendations.   Return in about 3 weeks (around 05/30/2020) for Routine OB, fasting labs.  Future Appointments  Date Time Provider [place]  05/30/2020  8:15 AM Tresea Mall, CNM [facility]  06/19/2020  9:45 AM [facility] NURSE [facility]  06/19/2020 10:00 AM [facility] US1 [facility] [place], NP

## 2020-05-09 NOTE — Patient Instructions (Signed)
Glucose Tolerance Test During Pregnancy Why am I having this test? The glucose tolerance test (GTT) is done to check how your body processes sugar (glucose). This is one of several tests used to diagnose diabetes that develops during pregnancy (gestational diabetes mellitus). Gestational diabetes is a temporary form of diabetes that some women develop during pregnancy. It usually occurs during the second trimester of pregnancy and goes away after delivery. Testing (screening) for gestational diabetes usually occurs between 24 and 28 weeks of pregnancy. You may have the GTT test after having a 1-hour glucose screening test if the results from that test indicate that you may have gestational diabetes. You may also have this test if:  You have a history of gestational diabetes.  You have a history of giving birth to very large babies or have experienced repeated fetal loss (stillbirth).  You have signs and symptoms of diabetes, such as: ? Changes in your vision. ? Tingling or numbness in your hands or feet. ? Changes in hunger, thirst, and urination that are not otherwise explained by your pregnancy. What is being tested? This test measures the amount of glucose in your blood at different times during a period of 3 hours. This indicates how well your body is able to process glucose. What kind of sample is taken?  Blood samples are required for this test. They are usually collected by inserting a needle into a blood vessel. How do I prepare for this test?  For 3 days before your test, eat normally. Have plenty of carbohydrate-rich foods.  Follow instructions from your health care provider about: ? Eating or drinking restrictions on the day of the test. You may be asked to not eat or drink anything other than water (fast) starting 8-10 hours before the test. ? Changing or stopping your regular medicines. Some medicines may interfere with this test. Tell a health care provider about:  All  medicines you are taking, including vitamins, herbs, eye drops, creams, and over-the-counter medicines.  Any blood disorders you have.  Any surgeries you have had.  Any medical conditions you have. What happens during the test? First, your blood glucose will be measured. This is referred to as your fasting blood glucose, since you fasted before the test. Then, you will drink a glucose solution that contains a certain amount of glucose. Your blood glucose will be measured again 1, 2, and 3 hours after drinking the solution. This test takes about 3 hours to complete. You will need to stay at the testing location during this time. During the testing period:  Do not eat or drink anything other than the glucose solution.  Do not exercise.  Do not use any products that contain nicotine or tobacco, such as cigarettes and e-cigarettes. If you need help stopping, ask your health care provider. The testing procedure may vary among health care providers and hospitals. How are the results reported? Your results will be reported as milligrams of glucose per deciliter of blood (mg/dL) or millimoles per liter (mmol/L). Your health care provider will compare your results to normal ranges that were established after testing a large group of people (reference ranges). Reference ranges may vary among labs and hospitals. For this test, common reference ranges are:  Fasting: less than 95-105 mg/dL (5.3-5.8 mmol/L).  1 hour after drinking glucose: less than 180-190 mg/dL (10.0-10.5 mmol/L).  2 hours after drinking glucose: less than 155-165 mg/dL (8.6-9.2 mmol/L).  3 hours after drinking glucose: 140-145 mg/dL (7.8-8.1 mmol/L). What do the   results mean? Results within reference ranges are considered normal, meaning that your glucose levels are well-controlled. If two or more of your blood glucose levels are high, you may be diagnosed with gestational diabetes. If only one level is high, your health care  provider may suggest repeat testing or other tests to confirm a diagnosis. Talk with your health care provider about what your results mean. Questions to ask your health care provider Ask your health care provider, or the department that is doing the test:  When will my results be ready?  How will I get my results?  What are my treatment options?  What other tests do I need?  What are my next steps? Summary  The glucose tolerance test (GTT) is one of several tests used to diagnose diabetes that develops during pregnancy (gestational diabetes mellitus). Gestational diabetes is a temporary form of diabetes that some women develop during pregnancy.  You may have the GTT test after having a 1-hour glucose screening test if the results from that test indicate that you may have gestational diabetes. You may also have this test if you have any symptoms or risk factors for gestational diabetes.  Talk with your health care provider about what your results mean. This information is not intended to replace advice given to you by your health care provider. Make sure you discuss any questions you have with your health care provider. Document Revised: 10/29/2018 Document Reviewed: 02/17/2017 Elsevier Patient Education  [place].

## 2020-05-25 ENCOUNTER — Other Ambulatory Visit: Payer: Self-pay

## 2020-05-25 DIAGNOSIS — O0990 Supervision of high risk pregnancy, unspecified, unspecified trimester: Secondary | ICD-10-CM

## 2020-05-25 NOTE — Addendum Note (Signed)
Addended by: Louisa Second E on: 05/25/2020 03:01 PM   Modules accepted: Orders

## 2020-05-30 ENCOUNTER — Other Ambulatory Visit: Payer: Self-pay

## 2020-05-30 ENCOUNTER — Encounter: Payer: Self-pay | Admitting: Advanced Practice Midwife

## 2020-05-30 ENCOUNTER — Ambulatory Visit (INDEPENDENT_AMBULATORY_CARE_PROVIDER_SITE_OTHER): Payer: Self-pay | Admitting: Advanced Practice Midwife

## 2020-05-30 VITALS — BP 121/66 | HR 92

## 2020-05-30 DIAGNOSIS — O0990 Supervision of high risk pregnancy, unspecified, unspecified trimester: Secondary | ICD-10-CM

## 2020-05-30 DIAGNOSIS — Z3A29 29 weeks gestation of pregnancy: Secondary | ICD-10-CM

## 2020-05-30 DIAGNOSIS — Z23 Encounter for immunization: Secondary | ICD-10-CM

## 2020-05-30 NOTE — Progress Notes (Signed)
°  Engineer, site for [employer].

## 2020-05-30 NOTE — Patient Instructions (Signed)
COVID-19 Vaccination if You Are Pregnant or Breastfeeding  The Society for Maternal-Fetal Medicine [facility]) and other pregnancy experts recommend that pregnant and lactating people be vaccinated against COVID-19. The Centers for Disease Control and Prevention (CDC) also recommend vaccination for "all people aged 34 years and older, including people who are pregnant, breastfeeding, trying to get pregnant now, or might become pregnant in the future." Vaccination is the best way to reduce the risks of COVID-19 infection and COVID-related complications for both you and your baby.  Three vaccines are available to prevent COVID-19: . The two-dose Pfizer vaccine for people 12 years and older--APPROVED by the [place] Food and Drug Administration on March 13, 2020 . The two-dose Moderna vaccine for people 18 years and older--AUTHORIZED for emergency use . The one-dose [employer] vaccine for people 18 years and older (you may also see this vaccine referred to as the "Janssen vaccine")--AUTHORIZED for emergency use  For those receiving the [place] and [facility], the second dose is given 21 days [employer]) and 28 days (Moderna) after the first dose. The [employer] vaccine is only one dose.  Information for Pregnant Individuals If you are pregnant or planning to become pregnant and are thinking about getting vaccinated, consider talking with your health care professional about the vaccine.   To help with your decision, you should consider the following key points: Anyone can get the COVID vaccines free of charge regardless of immigration status or whether they have insurance. You may be asked for your social security number, but it is  NOT required to get vaccinated.  What are benefits of getting the COVID-19 vaccines during pregnancy?  . The vaccines can help protect you from getting COVID-19. With the two-dose vaccines, you must get both doses for maximum effectiveness. It's not yet known how  long protection lasts.  . Another potential benefit is that getting the vaccine while pregnant may help you pass antiCOVID-19 antibodies to your baby. In numerous studies of vaccinated moms, antibodies were found in the umbilical cord blood of babies and in the mother's breastmilk.  . The CDC, along with other federal partners, are monitoring people who have been vaccinated for serious side effects. So far, more than 139,000 pregnant people have been vaccinated. No unexpected pregnancy or fetal problems have occurred. There have been no reports of any increased risk of pregnancy loss, growth problems, or birth defects.  . A safe vaccine is generally considered one in which the benefits of being vaccinated outweigh the risks. The current vaccines are not live vaccines. There is only a very small  chance that they cross the placenta, so it's unlikely that they even reach the fetus. Vaccines don't affect future fertility. The only people who should NOT get vaccinated are those who have had a severe allergic reaction to vaccines in the past or any vaccine ingredients.  . Side effects may occur in the first 3 days after getting vaccinated.1 These include mild to moderate fever, headache, and muscle aches. Side effects may be worse after the second dose of the [employer] and Moderna vaccines. Fever should be avoided during pregnancy,especially in the first trimester. Those who develop a fever after vaccination can take acetaminophen (Tylenol). This medication is safe to use during pregnancy and does not  affect how the vaccine works.   What are the known risks of getting COVID-19 during pregnancy?  About 1 to 3 per 1,000 pregnant women with COVID-19 will develop severe disease. Compared with those who  aren't pregnant, pregnant people infected by the COVID-19 virus: . Are 3 times more likely to need ICU care . Are 2 to 3 times more likely to need advanced life support and a breathing tube  . Have a small  increased risk of dying due to COVID-19 They may also be at increased risk of stillbirth and preterm birth.  What is my risk of getting COVID-19?  Your risk of getting COVID-19 depends on the chance that you will come into contact with another infected person. The risk may be higher if you live in a community where there is a lot of COVID-19 infection or work in healthcare or another high-contact setting.   What is my risk for severe complications if I get DVVOH-60?  Data show that older pregnant women; those with preexisting health conditions, such as a body mass index higher than 35 kg/m2, diabetes, and heart disorders; and Black or Latinx women have an especially increased risk of severe disease and death from COVID-19.   If you still have questions about the vaccines or need more information, ask your health care provider or go to the Centers for Disease Control and Prevention's COVID-19 vaccine webpage.   An Update on the [employer] Vaccine   In April 2021, the FDA and CDC called for a brief pause to use of the [employer] vaccine. They did so after reports of a severe side effect in a very small number of women younger than age 52 following vaccination. This side effect, called thrombosis with thrombocytopenia syndrome (TTS), causes blood clots (thrombosis) combined with low levels of platelets (thrombocytopenia).  TTS following the [employer] vaccine is extremely rare. At the time of this update, it has occurred in only 7 people per 1 million [employer] shots given. According to the CDC, being on hormonal birth control (the pill, patch, or ring), pregnancy, breastfeeding, or being recently pregnant does not make you more likely to develop TTS after getting the [employer] vaccine. The pause was lifted on November 12, 2019, after the FDA and CDC determined that the known benefits of the [employer] vaccine far outweigh the risks.   Health care professionals  have been alerted to the possibility of this side effect in people who have received the [facility] vaccine. National organizations continue to recommend COVID-19 vaccination with any of the vaccines for pregnant women. All women younger than age 60 years, whether pregnant, breastfeeding, or not, should be aware of the very rare risk of TTS after getting the [employer] vaccine. The Pfizer and Moderna vaccines don't have this risk. If you get the [employer] vaccine,  seek medical help right away if you develop any of the following symptoms within 3 weeks of getting your shot:  . Severe or persistent headaches or blurred vision . Shortness of breath . Chest pain . Leg swelling . Persistent abdominal pain . Easy bruising or tiny blood spots under the skin beyond the injection site  Experts continue to collect health and safety information from pregnant people who have been vaccinated. If you have questions about vaccination during pregnancy, visit the CDC website or talk to your health care professional. Information for Breastfeeding/Lactating Individuals The Society for Maternal-Fetal Medicine and other pregnancy experts recommend COVID-19 vaccination for people who are breastfeeding/lactating. You don't have to delay or stop  breastfeeding just because you get vaccinated.   Getting Vaccinated  You can get vaccinated at  any time during pregnancy. The CDC is committed to monitoring the vaccine's safety for all individuals. Your health professional or vaccine clinic may give you information about enrolling in the v-safe after vaccination health checker (see the box below).Even after you're fully vaccinated, it is important to follow the CDC's guidance for wearing a mask indoors in areas where there are substantial or high rates of COVID-19 infection.   What Happens When You Enroll in v-Safe?  The v-safe after vaccination health checker program lets the CDC check in with you after  your vaccination. At sign-up, you can indicate that you are pregnant. Once you do that, expect the following: . Someone may call you from the v-safe program to ask initial questions and get more information. . You may be asked to enroll in the vaccine pregnancy registry, which is collecting information about any effects of the vaccine during pregnancy. This is a great way to help scientists monitor the vaccine's safety and effectiveness.   References 1. [address] Armandina Gemma M, Wallace M, Curran KG, Chamberland M, et al. The Advisory Committee on [employer]' Interim Recommendation for Use of Pfizer-BioNTech  COVID-19 Vaccine -- [place], December 2020. MMWR Morbidity and Mortality Weekly Report 2020;69. 2. FDA Briefing Document. Janssen Ad26.COV2.S Vaccine for the Prevention of COVID-19. 2021. Accessed Sep 24, 2019; Available from: achegone.com 3. PFIZER-BIONTECH COVID-19 VACCINE [package insert] [place]: Pfizer and [place], [place]: Biontech;2020. 4. FDA Briefing Document. Moderna COVID-19 Vaccine. 2020. Accessed 2020, Dec 18; Available from: NameImpressions.gl  5. Lottie Dawson, Bordt EA, Atyeo C, Deriso E, Akinwunmi B, Young N, et al. COVID-19 vaccine response in pregnant and lactating women: a cohort study. Am J Obstet Gynecol 2021 Mar 24. 6. Panagiotakopoulos Carlean Jews, Alroy Bailiff, Lipkind HS, Jerold Coombe DS, et al. SARS-CoV-2 Infection Among Hospitalized Pregnant Women: Reasons for Admission and Pregnancy  Characteristics - [place], March 1-Dec 19, 2018. MMWR Morb Neysa Bonito Rep 2020 Sep 23;69(38):1355-9. 7. Zambrano LD, Kinney, Strid P, [place], Ralene Cork VT, et al. Update: Characteristics of Symptomatic Women of Reproductive Age with Laboratory-Confirmed SARSCoV-2 Infection by Pregnancy Status - [place] States, January 22-April 24, 2019. MMWR Morb Neysa Bonito Rep 2020 Nov  6;69(44):1641-7. 8. Delahoy MJ, Madaline Savage, Dorna Leitz PD, Jacinta Shoe, et al. Characteristics and Maternal and Birth Outcomes of Hospitalized Pregnant Women with Laboratory-Confirmed COVID-19 - COVID-NET, 75 States, March 1-March 13, 2019. MMWR Morb Neysa Bonito Rep 2020 Sep 25;69(38):1347-54.

## 2020-05-30 NOTE — Progress Notes (Signed)
   PRENATAL VISIT NOTE  Subjective:  Nicole Lindsey is a 34 y.o. G1P0000 at [redacted]w[redacted]d being seen today for ongoing prenatal care.  She is currently monitored for the following issues for this low-risk pregnancy and has Uterine fibroids affecting pregnancy in second trimester; Supervision of high risk pregnancy, antepartum; Advanced maternal age, 1st pregnancy; and Non-English speaking patient on their problem list.  Patient reports no complaints.  Contractions: Not present. Vag. Bleeding: None.  Movement: Present. Denies leaking of fluid.   The following portions of the patient's history were reviewed and updated as appropriate: allergies, current medications, past family history, past medical history, past social history, past surgical history and problem list.   Objective:   Vitals:   05/30/20 0833  BP: 121/66  Pulse: 92    Fetal Status: Fetal Heart Rate (bpm): 141 Fundal Height: 30 cm Movement: Present     General:  Alert, oriented and cooperative. Patient is in no acute distress.  Skin: Skin is warm and dry. No rash noted.   Cardiovascular: Normal heart rate noted  Respiratory: Normal respiratory effort, no problems with respiration noted  Abdomen: Soft, gravid, appropriate for gestational age.  Pain/Pressure: Absent     Pelvic: Cervical exam deferred        Extremities: Normal range of motion.  Edema: None  Mental Status: Normal mood and affect. Normal behavior. Normal judgment and thought content.   Assessment and Plan:  Pregnancy: G1P0000 at [redacted]w[redacted]d 1. Supervision of high risk pregnancy, antepartum - Covid vaccine recommended today - Tdap vaccine greater than or equal to 7yo IM  2. [redacted] weeks gestation of pregnancy - 2 hour GTT and 28 week labs today   Preterm labor symptoms and general obstetric precautions including but not limited to vaginal bleeding, contractions, leaking of fluid and fetal movement were reviewed in detail with the patient. Please refer to After Visit Summary  for other counseling recommendations.   Return in about 2 weeks (around 06/13/2020).  Future Appointments  Date Time Provider [place]  06/19/2020  9:45 AM [facility] NURSE [facility]  06/19/2020 10:00 AM [facility] US1 [facility] [place] DNP, CNM  05/30/20  8:53 AM

## 2020-05-31 LAB — CBC
Hematocrit: 30.8 % — ABNORMAL LOW (ref 34.0–46.6)
Hemoglobin: 9.5 g/dL — ABNORMAL LOW (ref 11.1–15.9)
MCH: 26.1 pg — ABNORMAL LOW (ref 26.6–33.0)
MCHC: 30.8 g/dL — ABNORMAL LOW (ref 31.5–35.7)
MCV: 85 fL (ref 79–97)
Platelets: 189 10*3/uL (ref 150–450)
RBC: 3.64 x10E6/uL — ABNORMAL LOW (ref 3.77–5.28)
RDW: 14.7 % (ref 11.7–15.4)
WBC: 6.5 10*3/uL (ref 3.4–10.8)

## 2020-05-31 LAB — GLUCOSE TOLERANCE, 2 HOURS W/ 1HR
Glucose, 1 hour: 157 mg/dL (ref 65–179)
Glucose, 2 hour: 119 mg/dL (ref 65–152)
Glucose, Fasting: 95 mg/dL — ABNORMAL HIGH (ref 65–91)

## 2020-05-31 LAB — RPR: RPR Ser Ql: NONREACTIVE

## 2020-05-31 LAB — HIV ANTIBODY (ROUTINE TESTING W REFLEX): HIV Screen 4th Generation wRfx: NONREACTIVE

## 2020-06-03 ENCOUNTER — Encounter: Payer: Self-pay | Admitting: Advanced Practice Midwife

## 2020-06-03 DIAGNOSIS — O24419 Gestational diabetes mellitus in pregnancy, unspecified control: Secondary | ICD-10-CM | POA: Insufficient documentation

## 2020-06-05 ENCOUNTER — Telehealth: Payer: Self-pay

## 2020-06-05 NOTE — Telephone Encounter (Addendum)
-----   Message from Tresea Mall, CNM sent at 06/03/2020  8:45 AM EST ----- Patient has GDM. Please call her and set her up with diabetes educator.   Called pt with [employer] # [phone] and unable to leave message due to voicemail box not set up.    Attempted to contact pt with [facility] # [phone] and unable to leave message due to voicemail box is not set up.    Mel Almond, RN  06/05/20

## 2020-06-06 NOTE — Telephone Encounter (Signed)
Attempted to contact pt with [facility] unable to leave message.  Pt scheduled for diabetes education appt on 06/13/20 after her OB provider appt in hopes that she will be able to stay for the appt due to being unable to reach her.    Mel Almond, RN  06/06/20

## 2020-06-07 ENCOUNTER — Other Ambulatory Visit: Payer: Self-pay | Admitting: Student

## 2020-06-07 DIAGNOSIS — O26892 Other specified pregnancy related conditions, second trimester: Secondary | ICD-10-CM

## 2020-06-13 ENCOUNTER — Other Ambulatory Visit: Payer: Self-pay

## 2020-06-13 ENCOUNTER — Encounter: Payer: Self-pay | Admitting: Advanced Practice Midwife

## 2020-06-13 ENCOUNTER — Ambulatory Visit (INDEPENDENT_AMBULATORY_CARE_PROVIDER_SITE_OTHER): Payer: Self-pay | Admitting: Advanced Practice Midwife

## 2020-06-13 VITALS — BP 120/65 | HR 82 | Wt 206.3 lb

## 2020-06-13 DIAGNOSIS — Z3A31 31 weeks gestation of pregnancy: Secondary | ICD-10-CM

## 2020-06-13 DIAGNOSIS — Z3490 Encounter for supervision of normal pregnancy, unspecified, unspecified trimester: Secondary | ICD-10-CM

## 2020-06-13 DIAGNOSIS — O24419 Gestational diabetes mellitus in pregnancy, unspecified control: Secondary | ICD-10-CM

## 2020-06-13 NOTE — Progress Notes (Signed)
   PRENATAL VISIT NOTE  Subjective:  Nicole Lindsey is a 33 y.o. G1P0000 at [redacted]w[redacted]d being seen today for ongoing prenatal care.  She is currently monitored for the following issues for this high-risk pregnancy and has Uterine fibroids affecting pregnancy in second trimester; Supervision of high risk pregnancy, antepartum; Advanced maternal age, 1st pregnancy; Non-English speaking patient; and Gestational diabetes on their problem list.  Patient reports no complaints.  Contractions: Not present. Vag. Bleeding: None.  Movement: Present. Denies leaking of fluid.   The following portions of the patient's history were reviewed and updated as appropriate: allergies, current medications, past family history, past medical history, past social history, past surgical history and problem list.   Objective:   Vitals:   06/13/20 1011  BP: 120/65  Pulse: 82  Weight: 206 lb 4.8 oz (93.6 kg)    Fetal Status: Fetal Heart Rate (bpm): 138 Fundal Height: 33 cm Movement: Present     General:  Alert, oriented and cooperative. Patient is in no acute distress.  Skin: Skin is warm and dry. No rash noted.   Cardiovascular: Normal heart rate noted  Respiratory: Normal respiratory effort, no problems with respiration noted  Abdomen: Soft, gravid, appropriate for gestational age.  Pain/Pressure: Absent     Pelvic: Cervical exam deferred        Extremities: Normal range of motion.  Edema: None  Mental Status: Normal mood and affect. Normal behavior. Normal judgment and thought content.   Assessment and Plan:  Pregnancy: G1P0000 at [redacted]w[redacted]d 1. [redacted] weeks gestation of pregnancy - Routine care - Informed of positive gestational diabetes test today - Fundus measuring 33cm today at [redacted]w[redacted]d. Continue to monitor growth.  2. Encounter for supervision of normal pregnancy, antepartum, unspecified gravidity - Routine care  3. Gestational diabetes mellitus (GDM) in third trimester, gestational diabetes method of control  unspecified - Informed of positive GDM test today - Patient scheduled to meet with diabetes educator for management and diet control  Preterm labor symptoms and general obstetric precautions including but not limited to vaginal bleeding, contractions, leaking of fluid and fetal movement were reviewed in detail with the patient. Please refer to After Visit Summary for other counseling recommendations.   Return in about 2 weeks (around 06/27/2020).  Future Appointments  Date Time Provider [place]  06/13/2020  2:15 PM [facility]  06/19/2020  9:45 AM [facility] NURSE [facility]  06/19/2020 10:00 AM [facility] US1 [facility] [place]    Willaim Rayas, Student-PA

## 2020-06-19 ENCOUNTER — Other Ambulatory Visit: Payer: Self-pay | Admitting: *Deleted

## 2020-06-19 ENCOUNTER — Ambulatory Visit: Payer: Self-pay | Admitting: *Deleted

## 2020-06-19 ENCOUNTER — Ambulatory Visit: Payer: Self-pay | Attending: Obstetrics and Gynecology

## 2020-06-19 ENCOUNTER — Encounter: Payer: Self-pay | Admitting: *Deleted

## 2020-06-19 ENCOUNTER — Other Ambulatory Visit: Payer: Self-pay

## 2020-06-19 DIAGNOSIS — O24419 Gestational diabetes mellitus in pregnancy, unspecified control: Secondary | ICD-10-CM

## 2020-06-19 DIAGNOSIS — O0990 Supervision of high risk pregnancy, unspecified, unspecified trimester: Secondary | ICD-10-CM

## 2020-06-19 DIAGNOSIS — Z3A31 31 weeks gestation of pregnancy: Secondary | ICD-10-CM

## 2020-06-19 DIAGNOSIS — O3413 Maternal care for benign tumor of corpus uteri, third trimester: Secondary | ICD-10-CM

## 2020-06-19 DIAGNOSIS — D259 Leiomyoma of uterus, unspecified: Secondary | ICD-10-CM | POA: Insufficient documentation

## 2020-06-19 DIAGNOSIS — O3410 Maternal care for benign tumor of corpus uteri, unspecified trimester: Secondary | ICD-10-CM | POA: Insufficient documentation

## 2020-06-19 DIAGNOSIS — O09523 Supervision of elderly multigravida, third trimester: Secondary | ICD-10-CM

## 2020-06-19 DIAGNOSIS — Z362 Encounter for other antenatal screening follow-up: Secondary | ICD-10-CM

## 2020-06-19 DIAGNOSIS — O24415 Gestational diabetes mellitus in pregnancy, controlled by oral hypoglycemic drugs: Secondary | ICD-10-CM

## 2020-06-20 ENCOUNTER — Ambulatory Visit: Payer: Self-pay | Admitting: Registered"

## 2020-06-20 ENCOUNTER — Encounter: Payer: Self-pay | Attending: Advanced Practice Midwife | Admitting: Registered"

## 2020-06-20 DIAGNOSIS — Z3A00 Weeks of gestation of pregnancy not specified: Secondary | ICD-10-CM | POA: Insufficient documentation

## 2020-06-20 DIAGNOSIS — O24419 Gestational diabetes mellitus in pregnancy, unspecified control: Secondary | ICD-10-CM

## 2020-06-20 NOTE — Progress Notes (Signed)
Interpreter services provided by Jocelyne from [facility]  Patient was seen on 06/20/20 for Gestational Diabetes self-management. EDD 08/15/20. Patient states no history of GDM. Diet history obtained. Patient does not eats variety of food groups, does not like protein, eats mostly potatoes, beans, and a lot of milk. Beverages include milk, water.    Patient reports no structured physical activity and no restrictions on activity.  The following learning objectives were met by the patient :   States the definition of Gestational Diabetes (future visit)  States why dietary management is important in controlling blood glucose  Describes the effects of carbohydrates on blood glucose levels (review at future visit)  Demonstrates ability to create a balanced meal plan (review at future visit)  Demonstrates carbohydrate counting (review at future visit)  States when to check blood glucose levels  Demonstrates proper blood glucose monitoring techniques  States the effect of stress and exercise on blood glucose levels (future visit)  States the importance of limiting caffeine and abstaining from alcohol and smoking  (future visit)  Plan:  Aim for 3 Carbohydrate Choices per meal (45 grams) +/- 1 either way  Aim for 1-2 Carbohydrate Choices per snack Begin reading food labels for Total Carbohydrate of foods If OK with your MD, consider  increasing your activity level by walking, Arm Chair Exercises or other activity daily as tolerated Begin checking Blood Glucose before breakfast and 2 hours after first bite of breakfast, lunch and dinner as directed by MD  Bring Log Book/Sheet and meter to every medical appointment  Baby Scripts: (BS 2.0 not capable of glucose management at this time.) Patient to record blood sugar on glucose log sheet  Take medication if directed by MD  Blood glucose monitor given: Pro[facility]y CBG: 139 mg/dL  Patient instructed to monitor glucose levels: FBS: 60 - 95  mg/dl 2 hour: <120 mg/dl  Patient received the following handouts:  Nutrition Diabetes and Pregnancy  Carbohydrate Counting List  Blood glucose Log Sheet  Patient will be seen for follow-up in 1 weeks or as needed.

## 2020-06-21 ENCOUNTER — Other Ambulatory Visit: Payer: Self-pay | Admitting: *Deleted

## 2020-06-21 ENCOUNTER — Other Ambulatory Visit: Payer: Self-pay

## 2020-06-21 DIAGNOSIS — O24419 Gestational diabetes mellitus in pregnancy, unspecified control: Secondary | ICD-10-CM

## 2020-06-22 ENCOUNTER — Telehealth: Payer: Self-pay

## 2020-06-22 DIAGNOSIS — O26892 Other specified pregnancy related conditions, second trimester: Secondary | ICD-10-CM

## 2020-06-22 DIAGNOSIS — R12 Heartburn: Secondary | ICD-10-CM

## 2020-06-22 MED ORDER — FAMOTIDINE 40 MG PO TABS: 40.0000 mg | ORAL_TABLET | Freq: Every day | ORAL | 0 refills | Status: DC

## 2020-06-22 NOTE — Telephone Encounter (Signed)
Fax received from [facility] requesting a refill for Pepcid 40 mg daily. Reviewed with Ephraim Hamburger, NP who gives verbal order for refill.

## 2020-06-27 ENCOUNTER — Other Ambulatory Visit: Payer: Self-pay

## 2020-06-27 ENCOUNTER — Ambulatory Visit: Payer: Self-pay | Admitting: Registered"

## 2020-06-27 VITALS — Wt 206.7 lb

## 2020-06-27 DIAGNOSIS — O24419 Gestational diabetes mellitus in pregnancy, unspecified control: Secondary | ICD-10-CM

## 2020-06-27 NOTE — Progress Notes (Signed)
Interpreter services provided by Marcial Pacas from [facility]  Patient was seen on 06/27/20 for follow-up assessment and education for Gestational Diabetes. EDD 08/15/20. Continued with GDM education that was not covered in first visit.  Patient states changes to diet/lifestyle including drinking less milk, eating less potatoes, eating more beans and greens. Patient states she is feeling better with diet changes and increased exercise. Patient states she is exercising in the mornings.   Pt states she is not checking after all her meals because she is out doing things when it is time to check. Pt states she has not checked blood sugar after meal that included green bananas.   RD checked patient's weight to make sure she wasn't losing weight with what sounds like decreased caloric intake.   Wt Readings from Last 3 Encounters:  06/27/20 206 lb 11.2 oz (93.8 kg)  06/13/20 206 lb 4.8 oz (93.6 kg)  05/09/20 198 lb 14.4 oz (90.2 kg)   Review of Log sheet:  FBS 93-102 mg/dL Post meals: 85-112 mg/dL   The following learning objectives reviewed during follow-up visit:   Strategies to lower fasting blood sugar  Importance of blood sugar data  Potential medication discussion at her MD appointment on Friday if her FBS remains elevated  Safety of medication.  Plan:  . Exercise in the evening . Bedtime snack . Record more CBGs  Patient instructed to monitor glucose levels: FBS: 60 - 95 mg/dl 2 hour: <120 mg/dl  Patient received the following handouts:  none  Patient will be seen for follow-up as needed.

## 2020-06-30 ENCOUNTER — Other Ambulatory Visit: Payer: Self-pay

## 2020-06-30 ENCOUNTER — Ambulatory Visit (INDEPENDENT_AMBULATORY_CARE_PROVIDER_SITE_OTHER): Payer: Self-pay | Admitting: Obstetrics & Gynecology

## 2020-06-30 VITALS — BP 129/58 | HR 86 | Wt 209.7 lb

## 2020-06-30 DIAGNOSIS — Z789 Other specified health status: Secondary | ICD-10-CM

## 2020-06-30 DIAGNOSIS — O24419 Gestational diabetes mellitus in pregnancy, unspecified control: Secondary | ICD-10-CM

## 2020-06-30 DIAGNOSIS — Z3A33 33 weeks gestation of pregnancy: Secondary | ICD-10-CM

## 2020-06-30 DIAGNOSIS — O0990 Supervision of high risk pregnancy, unspecified, unspecified trimester: Secondary | ICD-10-CM

## 2020-06-30 DIAGNOSIS — O3412 Maternal care for benign tumor of corpus uteri, second trimester: Secondary | ICD-10-CM

## 2020-06-30 DIAGNOSIS — D259 Leiomyoma of uterus, unspecified: Secondary | ICD-10-CM

## 2020-06-30 LAB — POCT URINALYSIS DIP (DEVICE)
Bilirubin Urine: NEGATIVE
Glucose, UA: NEGATIVE mg/dL
Ketones, ur: NEGATIVE mg/dL
Nitrite: NEGATIVE
Protein, ur: NEGATIVE mg/dL
Specific Gravity, Urine: 1.02 (ref 1.005–1.030)
Urobilinogen, UA: 0.2 mg/dL (ref 0.0–1.0)
pH: 7 (ref 5.0–8.0)

## 2020-06-30 NOTE — Progress Notes (Signed)
   PRENATAL VISIT NOTE  Subjective:  Nicole Lindsey is a 34 y.o. G1P0000 at [redacted]w[redacted]d being seen today for ongoing prenatal care.  She is currently monitored for the following issues for this high-risk pregnancy and has Uterine fibroids affecting pregnancy in second trimester; Supervision of high risk pregnancy, antepartum; Advanced maternal age, 1st pregnancy; Non-English speaking patient; and Gestational diabetes on their problem list.  Patient reports no complaints.  Contractions: Not present. Vag. Bleeding: None.  Movement: Present. Denies leaking of fluid.   Fasting BSs: 81-102.  2hr PP:  85-124.  The following portions of the patient's history were reviewed and updated as appropriate: allergies, current medications, past family history, past medical history, past social history, past surgical history and problem list.   Objective:   Vitals:   06/30/20 0821  BP: (!) 129/58  Pulse: 86  Weight: 209 lb 11.2 oz (95.1 kg)    Fetal Status: Fetal Heart Rate (bpm): 142   Movement: Present     General:  Alert, oriented and cooperative. Patient is in no acute distress.  Skin: Skin is warm and dry. No rash noted.   Cardiovascular: Normal heart rate noted  Respiratory: Normal respiratory effort, no problems with respiration noted  Abdomen: Soft, gravid, appropriate for gestational age.  Pain/Pressure: Absent     Pelvic: Cervical exam deferred        Extremities: Normal range of motion.  Edema: None  Mental Status: Normal mood and affect. Normal behavior. Normal judgment and thought content.   Assessment and Plan:  Pregnancy: G1P0000 at [redacted]w[redacted]d 1. Gestational diabetes mellitus (GDM), antepartum, gestational diabetes method of control unspecified - Continue to check fasting and 2hr PP BS - Growth scan already scheduled for 07/24/2020 with MFM - Recheck 2 weeks  2. [redacted] weeks gestation of pregnancy - Declines Covid vaccination today  3. Supervision of high risk pregnancy, antepartum  4.  Non-English speaking patient - Used translator today  5. Uterine fibroids affecting pregnancy in second trimester -fundal height 35cm today, non tender  Preterm labor symptoms and general obstetric precautions including but not limited to vaginal bleeding, contractions, leaking of fluid and fetal movement were reviewed in detail with the patient. Please refer to After Visit Summary for other counseling recommendations.    Future Appointments  Date Time Provider Amo  07/24/2020 11:15 AM [facility] NURSE [facility]  07/24/2020 11:30 AM [facility] US3 [facility]    Megan Salon, MD

## 2020-07-13 ENCOUNTER — Other Ambulatory Visit: Payer: Self-pay

## 2020-07-13 ENCOUNTER — Encounter: Payer: Self-pay | Admitting: *Deleted

## 2020-07-13 ENCOUNTER — Ambulatory Visit (INDEPENDENT_AMBULATORY_CARE_PROVIDER_SITE_OTHER): Payer: Self-pay | Admitting: Obstetrics & Gynecology

## 2020-07-13 VITALS — BP 124/77 | HR 93 | Wt 207.2 lb

## 2020-07-13 DIAGNOSIS — O3412 Maternal care for benign tumor of corpus uteri, second trimester: Secondary | ICD-10-CM

## 2020-07-13 DIAGNOSIS — D259 Leiomyoma of uterus, unspecified: Secondary | ICD-10-CM

## 2020-07-13 DIAGNOSIS — O24419 Gestational diabetes mellitus in pregnancy, unspecified control: Secondary | ICD-10-CM

## 2020-07-13 DIAGNOSIS — Z3A35 35 weeks gestation of pregnancy: Secondary | ICD-10-CM

## 2020-07-13 DIAGNOSIS — Z789 Other specified health status: Secondary | ICD-10-CM

## 2020-07-13 DIAGNOSIS — O0990 Supervision of high risk pregnancy, unspecified, unspecified trimester: Secondary | ICD-10-CM

## 2020-07-13 NOTE — Patient Instructions (Signed)
AREA PEDIATRIC/FAMILY PRACTICE PHYSICIANS  [place] ([address]) . [facility] o Chambliss, MD; Eniola, MD; Hale, MD; Hensel, MD; McDiarmid, MD; McIntyer, MD; Neal, MD; Walden, MD o [address] [place], [place] [address] o [phone] o Mon-Fri 8:30-12:30, 1:30-5:00 o Providers come to see babies at [facility] o Accepting Medicaid . [facility] o Limited providers who accept newborns: Koirala, MD; Morrow, MD; Wolters, MD o [address], [place], [place] [address] o [phone] o Mon-Fri 8:00-5:30 o Babies seen by providers at [facility] o Does NOT accept Medicaid o Please call early in hospitalization for appointment (limited availability)  . [facility] o Mulberry, MD o [address] [place], [place] [address] o [phone] o Mon, Tue, Thur, Fri 8:30-5:00, Wed 10:00-7:00 (closed 1-2pm) o Babies seen by [facility] providers o Accepting Medicaid . [facility] - Pediatrician o Rubin, MD o [address], [place], [place] [address] o [phone] o Mon-Fri 8:30-5:00, Sat 8:30-12:00 o Provider comes to see babies at [facility] o Accepting Medicaid o Must have been referred from current patients or contacted office prior to delivery . [facility] ([facility]) o Brown, MD; Chandler, MD; Ettefagh, MD; Grant, MD; Lester, MD; McCormick, MD; McQueen, MD; Prose, MD; Simha, MD; Stanley, MD; Stryffeler, NP; Tebben, NP o [address], [place], [place] [address] o [phone] o Mon, Tue, Thur, Fri 8:30-5:30, Wed 9:30-5:30, Sat 8:30-12:30 o Babies seen by [facility] providers o Accepting Medicaid o Only accepting infants of first-time parents or siblings of current patients o Hospital discharge coordinator will make follow-up appointment . Nicole Lindsey o [address],  [place], [place]  [address] o [phone]   Fax - [phone] . [facility] o [address], [place], [place]  [address] o Phone - [phone]   Fax - [phone] . Shilpa Gosrani o [address], [place], [place]  [address] o [phone]  [place] ([address]) . [facility] o Bates, MD; [place]assfield, MD; Cooper, Cox, MD; MD; Davis, MD; Dovico, MD; Ettefaugh, MD; Little, MD; Lowe, MD; Keiffer, MD; Melvin, MD; Sumner, MD; Williams, MD o [address], [place], [place] [address] o [phone] o Mon-Fri 8:30-5:00 (extended evenings Mon-Thur as needed), Sat-Sun 10:00-1:00 o Providers come to see babies at [facility] o Accepting Medicaid for families of first-time babies and families with all children in the household age 3 and under. Must register with office prior to making appointment (M-F only). . [facility] o Henson, NP; Knapp, MD; Lalonde, MD; Tysinger, PA o [address] [place], [place] [address] o [phone] o Mon-Fri 8:00-5:00 o Babies seen by providers at [facility] o Does NOT accept Medicaid/Commercial Insurance Only . [facility] ([facility])  o Artis, MD; Barnes, MD; Bratton, MD; Coccaro, MD; Lockett Gardner, MD; Kramer, MD; Marshall, MD; Netherton, MD; Poleto, MD; Skinner, MD o [address] [place], [place] [address] o [phone] o Mon-Fri 8:30-5:30, Sat (Oct.-Mar.) 9:00-1:00 o Babies seen by providers at [facility] o Accepting Medicaid  [place] ([address]) . [facility] o Reid, MD; Warner, MD o [address], , [place] [address] o [phone] o Mon-Fri 8:30-5:00, Sat 8:30-12:00 o Providers come to see babies at [facility] o Does NOT accept Medicaid . [facility] o Becker, PA; Hagler, MD; Scifres, PA; Sun, MD; Swayne, MD o [address],  [place], [place] [address] o [phone] o Mon-Fri 8:00-5:00 o Babies seen by providers at [facility] o Does NOT accept Medicaid o Only accepting babies of parents who are patients o Please call early in hospitalization for appointment (limited availability) . [facility] o Clark, MD; Frye, MD; Kelleher, MD; Mack, NP; Miller, MD; O'Keller, MD; Patterson, NP; Pudlo, MD; Puzio, MD; Thomas, MD; Tucker, MD; Twiselton, MD o [address], [place], [place] [address] o [phone] o Mon-Fri 8:00-5:00, Sat 9:00-12:00 o Providers come to see babies at [facility] o Does NOT accept Medicaid  [place] ([address]) . [facility] o Limited providers accepting new patients: Brake, NP; Wharton, PA o [address], [place], [place] [address] o [phone] o Mon-Fri 8:00-5:00 o Babies seen by providers at [facility] o Does NOT accept Medicaid o Only accepting babies of parents who are patients o Please call early in hospitalization for appointment (limited availability) . [facility] o Gay, MD; Quinlan, MD o [address] [place], [place] [address] o [phone] (press 1 to schedule appointment) o Mon-Fri 8:00-5:00 o Providers come to see babies at [facility] o Does NOT accept Medicaid . [facility] o Mazer, MD o [address] [place], [place] [address] o [phone] o Mon-Fri 8:30-5:00 (lunch 12:30-1:00), extended hours by appointment only Wed 5:00-6:30 o Babies seen by [facility] providers o Accepting Medicaid . [facility] o Banks, MD; Jordan, MD; Koberlein, MD o [address], [place], [place] [address] o [phone] o Mon-Fri 8:00-5:00 o Babies seen by [facility] providers o Does NOT accept Medicaid . [facility] o Parker, MD; Hunter, MD; Wallace, DO o [address] [place], [place]  [address] o [phone] o Mon-Fri 8:00-5:00 o Babies seen by [facility] providers o Does NOT accept Medicaid . [facility] o Brandon, PA; Brecken, PA; Christy, NP; Dees, MD; DeClaire, MD; DeWeese, MD; Hansen, NP; Mills, NP; Parrish, NP; Smoot, NP; Summer, MD; Vapne, MD o [address] [place], [place] [address] o [phone] o Mon-Fri 8:30-5:00, Sat 10:00-1:00 o Providers come to see babies at [facility] o Does NOT accept Medicaid o Free prenatal information session Tuesdays at 4:45pm . [facility] o Bouska, MD; Gordon, PA; Jeffery, PA; Weber, PA o [address] [place] [address] o [phone] o Mon-Fri 7:30-5:30 o Babies seen by [facility] providers . [facility] o [address], [place], [place]  [address] o [phone]   Fax - [phone]  [place] ([address]) . [facility] o Reese, MD o [address] [place], [place] [address] o [phone] o Mon-Thur 8:00-6:00 o Providers come to see babies at [facility] o Accepting Medicaid . [facility] o Anderson, NP; Badger, MD; Beal, PA; Spencer, PA o [address] [place], [place] [address] o [phone] o Mon-Thur 7:30-7:30, Fri 7:30-4:30 o Babies seen by [facility] providers o Accepting Medicaid . [facility] o Agbuya, MD; Klett, NP; Romgoolam, MD o [address], [place], [place] [address] o [phone] o Mon-Fri 8:30-5:00, Sat 8:30-12:00 o Providers come to see babies at [facility] o Accepting Medicaid o Must have "Meet & Greet" appointment at office prior to delivery . [facility] ([facility] o McCord,   MD; Wallace, MD; Wood, MD o [address], [place], [place] [address] o [phone] o Mon-Wed 8:00-6:00, Thur-Fri 8:00-5:00, Sat 9:00-12:00 o Providers come to  see babies at [facility] o Does NOT accept Medicaid o Only accepting siblings of current patients . [facility]  o [address], [place], [place]  [address] o [phone]   Fax - [phone] . [facility] o [address], [place], [place]  [address] o [phone]   Fax - [phone]  [place] ([address] & [address]) . [facility] o Cirigliano, DO; Matthews, DO o [address] [place], [place] [address] o [phone] o Mon-Fri 7:00-5:00 o Babies seen by [facility] providers o Does NOT accept Medicaid . [facility] o Briscoe, MD; Howley, PA; Moreira, PA o [address], [place], [place] [address] o [phone] o Mon-Fri 8:00-5:00 o Babies seen by [facility] providers o Accepting Medicaid . [facility] o Boyd, MD; Church, PA; Jones, NP; Osborn, PA o [address], [place], [place] [address] o [phone] o Mon-Fri 8:00-5:00 o Babies seen by providers at [facility] o Accepting Medicaid   

## 2020-07-13 NOTE — Progress Notes (Signed)
   PRENATAL VISIT NOTE  Subjective:  Nicole Lindsey is a 34 y.o. G1P0000 at [redacted]w[redacted]d being seen today for ongoing prenatal care.  She is currently monitored for the following issues for this high-risk pregnancy and has Uterine fibroids affecting pregnancy in second trimester; Supervision of high risk pregnancy, antepartum; Advanced maternal age, 1st pregnancy; Non-English speaking patient; and Gestational diabetes on their problem list.  Patient reports no complaints.  Contractions: Not present. Vag. Bleeding: None.  Movement: Present. Denies leaking of fluid.   The following portions of the patient's history were reviewed and updated as appropriate: allergies, current medications, past family history, past medical history, past social history, past surgical history and problem list.   Brought BSs with her.  Fastings <95.  2 hr PPs are all < 123 except two outlying values of 142 and 143.  These are both evening.  D/w pt and diet choices at dinner.  Objective:   Vitals:   07/13/20 1405  BP: 124/77  Pulse: 93  Weight: 207 lb 3.2 oz (94 kg)    Fetal Status: Fetal Heart Rate (bpm): 144   Movement: Present     General:  Alert, oriented and cooperative. Patient is in no acute distress.  Skin: Skin is warm and dry. No rash noted.   Cardiovascular: Normal heart rate noted  Respiratory: Normal respiratory effort, no problems with respiration noted  Abdomen: Soft, gravid, appropriate for gestational age.  Pain/Pressure: Absent     Pelvic: Cervical exam deferred        Extremities: Normal range of motion.  Edema: None  Mental Status: Normal mood and affect. Normal behavior. Normal judgment and thought content.   Assessment and Plan:  Pregnancy: G1P0000 at [redacted]w[redacted]d 1. Supervision of high risk pregnancy, antepartum -taking PNV  2. [redacted] weeks gestation of pregnancy - follow up 1 weeks.  Will obtain cultures at that visit.  3. Uterine fibroids affecting pregnancy in second trimester - measuring 2 cm  above 35 weeks  4. Non-English speaking patient - translator used for entire visit  5. Gestational diabetes mellitus (GDM), antepartum, gestational diabetes method of control unspecified - Month growth scans being performed.  Has appt scheduled 07/24/2020 - Continue to keep BSs, fasting and 2 hr PP.  New sheet is given.  Preterm labor symptoms and general obstetric precautions including but not limited to vaginal bleeding, contractions, leaking of fluid and fetal movement were reviewed in detail with the patient. Please refer to After Visit Summary for other counseling recommendations.   Return in about 1 week (around 07/20/2020).  Future Appointments  Date Time Provider [place]  07/24/2020 11:15 AM [facility] NURSE [facility]  07/24/2020 11:30 AM [facility] US3 [facility]    Megan Salon, MD

## 2020-07-20 ENCOUNTER — Other Ambulatory Visit (HOSPITAL_COMMUNITY)
Admission: RE | Admit: 2020-07-20 | Discharge: 2020-07-20 | Disposition: A | Discharge status: Home / Self Care | Payer: Medicaid Other | Source: Ambulatory Visit | Attending: Obstetrics and Gynecology | Admitting: Obstetrics and Gynecology

## 2020-07-20 ENCOUNTER — Other Ambulatory Visit: Payer: Self-pay

## 2020-07-20 ENCOUNTER — Ambulatory Visit (INDEPENDENT_AMBULATORY_CARE_PROVIDER_SITE_OTHER): Payer: Self-pay | Admitting: Obstetrics and Gynecology

## 2020-07-20 VITALS — BP 108/72 | HR 120 | Wt 205.5 lb

## 2020-07-20 DIAGNOSIS — O0990 Supervision of high risk pregnancy, unspecified, unspecified trimester: Secondary | ICD-10-CM | POA: Insufficient documentation

## 2020-07-20 DIAGNOSIS — Z3A36 36 weeks gestation of pregnancy: Secondary | ICD-10-CM

## 2020-07-20 DIAGNOSIS — Z789 Other specified health status: Secondary | ICD-10-CM

## 2020-07-20 DIAGNOSIS — O24410 Gestational diabetes mellitus in pregnancy, diet controlled: Secondary | ICD-10-CM

## 2020-07-20 NOTE — Patient Instructions (Signed)

## 2020-07-20 NOTE — Progress Notes (Signed)
   PRENATAL VISIT NOTE  Subjective:  Nicole Lindsey is a 34 y.o. G1P0000 at [redacted]w[redacted]d being seen today for ongoing prenatal care.  She is currently monitored for the following issues for this high-risk pregnancy and has Uterine fibroids affecting pregnancy in third trimester; Supervision of high risk pregnancy, antepartum; Advanced maternal age, 1st pregnancy; Non-English speaking patient; Gestational diabetes; and [redacted] weeks gestation of pregnancy on their problem list.  Patient doing well with no acute concerns today. She reports no complaints.  Contractions: Not present. Vag. Bleeding: None.  Movement: Present. Denies leaking of fluid.   The following portions of the patient's history were reviewed and updated as appropriate: allergies, current medications, past family history, past medical history, past social history, past surgical history and problem list. Problem list updated.  Objective:   Vitals:   07/20/20 1516  BP: 108/72  Pulse: (!) 120  Weight: 205 lb 8 oz (93.2 kg)    Fetal Status: Fetal Heart Rate (bpm): 154 Fundal Height: 38 cm Movement: Present     General:  Alert, oriented and cooperative. Patient is in no acute distress.  Skin: Skin is warm and dry. No rash noted.   Cardiovascular: Normal heart rate noted  Respiratory: Normal respiratory effort, no problems with respiration noted  Abdomen: Soft, gravid, appropriate for gestational age.  Pain/Pressure: Absent     Pelvic: Cervical exam performed Dilation: Closed Effacement (%): 70 Station: -2  Extremities: Normal range of motion.  Edema: None  Mental Status:  Normal mood and affect. Normal behavior. Normal judgment and thought content.  FBS:83-92 PPBS:101-123 Assessment and Plan:  Pregnancy: G1P0000 at [redacted]w[redacted]d  1. Supervision of high risk pregnancy, antepartum  - GC/Chlamydia probe amp ([place])not at [facility] - Culture, beta strep (group b only)  2. Diet controlled gestational diabetes mellitus (GDM) in third  trimester Excellent blood sugar control, growth scan next week  3. Non-English speaking patient Digital interpreter used  4. [redacted] weeks gestation of pregnancy   Preterm labor symptoms and general obstetric precautions including but not limited to vaginal bleeding, contractions, leaking of fluid and fetal movement were reviewed in detail with the patient.  Please refer to After Visit Summary for other counseling recommendations.   Return in about 1 week (around 07/27/2020) for [facility], in person.   Mariel Aloe, MD

## 2020-07-22 NOTE — L&D Delivery Note (Addendum)
OB/GYN Faculty Practice Delivery Note  Nicole Lindsey is a 35 y.o. G1P0000 s/p vaginal delivery at [redacted]w[redacted]d. She was admitted for spontaneous onset of labor, AGDM1.   ROM: 6h 59m with clear fluid GBS Status: Negative Maximum Maternal Temperature: 98.2 F  Labor Progress: . Initial SVE: 6/90/0, AROM at 0618, complete at 0919 without augmentation. Pitocin started during labor down process to increase frequency and strength of contractions.  Delivery Date/Time: 08/04/20 at 1304 Delivery: At bedside with RN pushing until patient was crowning. Head delivered LOA. No nuchal cord present. Shoulder and body delivered in usual fashion. Infant with spontaneous cry, placed on mother's abdomen, dried and stimulated. Cord clamped x 2 after 1-minute delay, and cut by RN. Cord blood drawn. Placenta delivered spontaneously, intact, with 3-vessel cord. Fundus firm with massage and Pitocin. Labia, perineum, vagina, and cervix inspected, 1st degree sulcus laceration found and repaired with 3-0 Monocryl .   Placenta: 72/09/47 at 0962 Complications: None Lacerations: 1st degree sulcus EBL: 725 mL Analgesia: Epidural  Postpartum Planning [x]  transfer orders to MB [x]  discharge summary started & shared [x]  message to sent to schedule follow-up  [x]  lists updated [x]  vaccines UTD  Infant: Girl  APGARs 9/10  weight per medical record  [name], [name] 08/04/2020, 1:48 PM

## 2020-07-23 LAB — GC/CHLAMYDIA PROBE AMP (~~LOC~~) NOT AT ARMC
Chlamydia: NEGATIVE
Comment: NEGATIVE
Comment: NORMAL
Neisseria Gonorrhea: NEGATIVE

## 2020-07-24 ENCOUNTER — Encounter: Payer: Self-pay | Admitting: *Deleted

## 2020-07-24 ENCOUNTER — Ambulatory Visit: Payer: Medicaid Other | Attending: Obstetrics and Gynecology

## 2020-07-24 ENCOUNTER — Ambulatory Visit: Payer: Medicaid Other | Admitting: *Deleted

## 2020-07-24 ENCOUNTER — Other Ambulatory Visit: Payer: Self-pay

## 2020-07-24 DIAGNOSIS — O3413 Maternal care for benign tumor of corpus uteri, third trimester: Secondary | ICD-10-CM

## 2020-07-24 DIAGNOSIS — Z3A36 36 weeks gestation of pregnancy: Secondary | ICD-10-CM

## 2020-07-24 DIAGNOSIS — O24419 Gestational diabetes mellitus in pregnancy, unspecified control: Secondary | ICD-10-CM | POA: Diagnosis not present

## 2020-07-24 DIAGNOSIS — O09523 Supervision of elderly multigravida, third trimester: Secondary | ICD-10-CM

## 2020-07-24 DIAGNOSIS — O24410 Gestational diabetes mellitus in pregnancy, diet controlled: Secondary | ICD-10-CM

## 2020-07-24 DIAGNOSIS — O0990 Supervision of high risk pregnancy, unspecified, unspecified trimester: Secondary | ICD-10-CM | POA: Insufficient documentation

## 2020-07-24 DIAGNOSIS — D259 Leiomyoma of uterus, unspecified: Secondary | ICD-10-CM

## 2020-07-24 DIAGNOSIS — Z362 Encounter for other antenatal screening follow-up: Secondary | ICD-10-CM

## 2020-07-24 LAB — CULTURE, BETA STREP (GROUP B ONLY): Strep Gp B Culture: NEGATIVE

## 2020-07-31 ENCOUNTER — Other Ambulatory Visit: Payer: Self-pay

## 2020-07-31 ENCOUNTER — Other Ambulatory Visit (HOSPITAL_COMMUNITY)
Admission: RE | Admit: 2020-07-31 | Discharge: 2020-07-31 | Disposition: A | Discharge status: Home / Self Care | Payer: Medicaid Other | Source: Ambulatory Visit | Attending: Obstetrics and Gynecology | Admitting: Obstetrics and Gynecology

## 2020-07-31 ENCOUNTER — Ambulatory Visit (INDEPENDENT_AMBULATORY_CARE_PROVIDER_SITE_OTHER): Payer: Medicaid Other | Admitting: Obstetrics and Gynecology

## 2020-07-31 ENCOUNTER — Telehealth (HOSPITAL_COMMUNITY): Payer: Self-pay | Admitting: *Deleted

## 2020-07-31 VITALS — BP 114/73 | HR 91 | Wt 197.1 lb

## 2020-07-31 DIAGNOSIS — O3413 Maternal care for benign tumor of corpus uteri, third trimester: Secondary | ICD-10-CM | POA: Diagnosis not present

## 2020-07-31 DIAGNOSIS — R12 Heartburn: Secondary | ICD-10-CM

## 2020-07-31 DIAGNOSIS — O24410 Gestational diabetes mellitus in pregnancy, diet controlled: Secondary | ICD-10-CM | POA: Diagnosis not present

## 2020-07-31 DIAGNOSIS — Z3A37 37 weeks gestation of pregnancy: Secondary | ICD-10-CM

## 2020-07-31 DIAGNOSIS — O0990 Supervision of high risk pregnancy, unspecified, unspecified trimester: Secondary | ICD-10-CM | POA: Diagnosis not present

## 2020-07-31 DIAGNOSIS — O26892 Other specified pregnancy related conditions, second trimester: Secondary | ICD-10-CM

## 2020-07-31 DIAGNOSIS — O09513 Supervision of elderly primigravida, third trimester: Secondary | ICD-10-CM | POA: Diagnosis not present

## 2020-07-31 DIAGNOSIS — Z789 Other specified health status: Secondary | ICD-10-CM

## 2020-07-31 DIAGNOSIS — D259 Leiomyoma of uterus, unspecified: Secondary | ICD-10-CM

## 2020-07-31 MED ORDER — FAMOTIDINE 40 MG PO TABS: 40.0000 mg | ORAL_TABLET | Freq: Every day | ORAL | 0 refills | Status: DC

## 2020-07-31 NOTE — Telephone Encounter (Signed)
Preadmission screen Interpreter number [phone]

## 2020-07-31 NOTE — Progress Notes (Signed)
   PRENATAL VISIT NOTE  Subjective:  Nicole Lindsey is a 35 y.o. G1P0000 at [redacted]w[redacted]d being seen today for ongoing prenatal care.  She is currently monitored for the following issues for this high-risk pregnancy and has Uterine fibroids affecting pregnancy in third trimester; Supervision of high risk pregnancy, antepartum; Advanced maternal age, 1st pregnancy; Non-English speaking patient; Gestational diabetes; [redacted] weeks gestation of pregnancy; and [redacted] weeks gestation of pregnancy on their problem list.  Prior to leaving pt relayed concern about vaginal itching.  A self swab was ordered.  Good blood glucose control noted with diet.  Patient doing well with no acute concerns today. She reports no complaints.  Contractions: Not present. Vag. Bleeding: None.  Movement: Present. Denies leaking of fluid.   The following portions of the patient's history were reviewed and updated as appropriate: allergies, current medications, past family history, past medical history, past social history, past surgical history and problem list. Problem list updated.  Objective:   Vitals:   07/31/20 1032  BP: 114/73  Pulse: 91  Weight: 197 lb 1.6 oz (89.4 kg)    Fetal Status: Fetal Heart Rate (bpm): 140   Movement: Present     General:  Alert, oriented and cooperative. Patient is in no acute distress.  Skin: Skin is warm and dry. No rash noted.   Cardiovascular: Normal heart rate noted  Respiratory: Normal respiratory effort, no problems with respiration noted  Abdomen: Soft, gravid, appropriate for gestational age.  Pain/Pressure: Absent     Pelvic: Cervical exam deferred        Extremities: Normal range of motion.  Edema: None  Mental Status:  Normal mood and affect. Normal behavior. Normal judgment and thought content.    FBS: 83-92 PPBS:101-123 Assessment and Plan:  Pregnancy: G1P0000 at [redacted]w[redacted]d  1. Diet controlled gestational diabetes mellitus (GDM) in third trimester Good control, will schedule IOL at 40  weeks  2. Uterine fibroids affecting pregnancy in third trimester   3. Supervision of high risk pregnancy, antepartum Schedule IOL after 39 weeks, last AFI 6.78, EFW 82%  4. Primigravida of advanced maternal age in third trimester   5. Non-English speaking patient Interpreter present  6. [redacted] weeks gestation of pregnancy   Term labor symptoms and general obstetric precautions including but not limited to vaginal bleeding, contractions, leaking of fluid and fetal movement were reviewed in detail with the patient.  Induction orders placed  Please refer to After Visit Summary for other counseling recommendations.   Return in about 1 week (around 08/07/2020) for [facility], in person.   Lynnda Shields, MD

## 2020-08-01 ENCOUNTER — Telehealth (HOSPITAL_COMMUNITY): Payer: Self-pay | Admitting: *Deleted

## 2020-08-01 ENCOUNTER — Encounter (HOSPITAL_COMMUNITY): Payer: Self-pay | Admitting: *Deleted

## 2020-08-01 LAB — CERVICOVAGINAL ANCILLARY ONLY
Bacterial Vaginitis (gardnerella): NEGATIVE
Candida Glabrata: NEGATIVE
Candida Vaginitis: POSITIVE — AB
Comment: NEGATIVE
Comment: NEGATIVE
Comment: NEGATIVE
Comment: NEGATIVE
Trichomonas: NEGATIVE

## 2020-08-01 NOTE — Telephone Encounter (Signed)
Preadmission screen Interpreter number [phone]

## 2020-08-02 ENCOUNTER — Telehealth: Payer: Self-pay | Admitting: *Deleted

## 2020-08-02 ENCOUNTER — Inpatient Hospital Stay (HOSPITAL_COMMUNITY)
Admission: AD | Admit: 2020-08-02 | Discharge: 2020-08-03 | Disposition: A | Discharge status: Home / Self Care | Payer: Medicaid Other | Attending: Obstetrics & Gynecology | Admitting: Obstetrics & Gynecology

## 2020-08-02 ENCOUNTER — Other Ambulatory Visit: Payer: Self-pay

## 2020-08-02 DIAGNOSIS — O0990 Supervision of high risk pregnancy, unspecified, unspecified trimester: Secondary | ICD-10-CM

## 2020-08-02 DIAGNOSIS — O471 False labor at or after 37 completed weeks of gestation: Secondary | ICD-10-CM

## 2020-08-02 DIAGNOSIS — Z3A38 38 weeks gestation of pregnancy: Secondary | ICD-10-CM | POA: Insufficient documentation

## 2020-08-02 DIAGNOSIS — O26853 Spotting complicating pregnancy, third trimester: Secondary | ICD-10-CM | POA: Insufficient documentation

## 2020-08-02 MED ORDER — TERCONAZOLE 0.8 % VA CREA: TOPICAL_CREAM | VAGINAL | 0 refills | Status: DC

## 2020-08-02 NOTE — Telephone Encounter (Signed)
Called pt w/[employer] interpreter [phone]. I informed pt of her test result showing that she has vaginal yeast. A prescription will be sent to her pharmacy. Dosing instructions were given. Pt stated that had some abdominal pain earlier today as well as one episode of vaginal discharge which was thick with water and blood. Pt endorses good FM has occurred since the episode. She reports only a small amount of pain now. Pt was advised to go to the hospital if she develops more fluid leaking from vagina, or strong abdominal pain occurring every 5 minutes for more than one hour. If she has decreased fetal movement, she should call our office. Pt should keep appt as scheduled on 1/17. She voiced understanding of all information and instructions given.

## 2020-08-03 ENCOUNTER — Encounter (HOSPITAL_COMMUNITY): Payer: Self-pay | Admitting: Obstetrics & Gynecology

## 2020-08-03 DIAGNOSIS — Z3A38 38 weeks gestation of pregnancy: Secondary | ICD-10-CM

## 2020-08-03 DIAGNOSIS — O26853 Spotting complicating pregnancy, third trimester: Secondary | ICD-10-CM | POA: Diagnosis present

## 2020-08-03 DIAGNOSIS — O471 False labor at or after 37 completed weeks of gestation: Secondary | ICD-10-CM

## 2020-08-03 NOTE — Discharge Instructions (Signed)

## 2020-08-03 NOTE — MAU Note (Signed)
I have communicated with Derrill Memo, CNM  and reviewed vital signs:  Vitals:   08/02/20 2355 08/03/20 0035  BP: (!) 115/55 112/69  Pulse: 87 97  Resp: 16   Temp: 97.8 F (36.6 C)     Vaginal exam:  Dilation: Closed Effacement (%):  (thin) Station: -2 Exam by:: V Patent examiner,   Also reviewed contraction pattern and that non-stress test is reactive.  It has been documented that patient is contracting every 7-10 minutes with a closed cervix not indicating active labor.  Patient denies any other complaints.  Based on this report provider has given order for discharge.  A discharge order and diagnosis entered by a provider.   Labor discharge instructions reviewed with patient.

## 2020-08-03 NOTE — MAU Provider Note (Signed)
    S: [name] is a 35 y.o. G1P0000 at [redacted]w[redacted]d  who presents to MAU today complaining of irreg contractions and sm bloody show. She denies vaginal bleeding. She denies LOF. She reports normal fetal movement.    O: BP 112/69 (BP Location: Left Arm)   Pulse 97   Temp 97.8 F (36.6 C)   Resp 16   Ht 5\' 8"  (1.727 m)   Wt 91.2 kg   LMP 11/09/2019   BMI 30.56 kg/m  GENERAL: Well-developed, well-nourished female in no acute distress.  HEAD: Normocephalic, atraumatic.  CHEST: Normal effort of breathing, regular heart rate ABDOMEN: Soft, nontender, gravid  Cervical exam:  Dilation: Closed Effacement (%):  (thin) Station: -2 Exam by:: V Fisher RN   Fetal Monitoring: Cat 1 Baseline: 130-140s Variability: avg LTV Accelerations: + Decelerations: none Contractions: approx 4 in 30mins   A: SIUP at [redacted]w[redacted]d  False labor  P: D/C home with labor precautions Keep next OB appt as scheduled  Myrtis Ser, CNM 08/03/2020 1:00 AM

## 2020-08-04 ENCOUNTER — Inpatient Hospital Stay (HOSPITAL_COMMUNITY)
Admission: AD | Admit: 2020-08-04 | Discharge: 2020-08-06 | DRG: 806 | Disposition: A | Discharge status: Home / Self Care | Payer: Medicaid Other | Attending: Obstetrics & Gynecology | Admitting: Obstetrics & Gynecology

## 2020-08-04 ENCOUNTER — Inpatient Hospital Stay (HOSPITAL_COMMUNITY): Payer: Medicaid Other | Admitting: Anesthesiology

## 2020-08-04 ENCOUNTER — Encounter (HOSPITAL_COMMUNITY): Payer: Self-pay | Admitting: Obstetrics and Gynecology

## 2020-08-04 ENCOUNTER — Other Ambulatory Visit: Payer: Self-pay

## 2020-08-04 DIAGNOSIS — O0990 Supervision of high risk pregnancy, unspecified, unspecified trimester: Secondary | ICD-10-CM

## 2020-08-04 DIAGNOSIS — O26893 Other specified pregnancy related conditions, third trimester: Secondary | ICD-10-CM | POA: Diagnosis present

## 2020-08-04 DIAGNOSIS — Z789 Other specified health status: Secondary | ICD-10-CM

## 2020-08-04 DIAGNOSIS — O09519 Supervision of elderly primigravida, unspecified trimester: Secondary | ICD-10-CM

## 2020-08-04 DIAGNOSIS — O9081 Anemia of the puerperium: Secondary | ICD-10-CM | POA: Diagnosis not present

## 2020-08-04 DIAGNOSIS — O4202 Full-term premature rupture of membranes, onset of labor within 24 hours of rupture: Secondary | ICD-10-CM | POA: Diagnosis not present

## 2020-08-04 DIAGNOSIS — O479 False labor, unspecified: Secondary | ICD-10-CM

## 2020-08-04 DIAGNOSIS — O24410 Gestational diabetes mellitus in pregnancy, diet controlled: Secondary | ICD-10-CM

## 2020-08-04 DIAGNOSIS — D62 Acute posthemorrhagic anemia: Secondary | ICD-10-CM | POA: Diagnosis not present

## 2020-08-04 DIAGNOSIS — O99893 Other specified diseases and conditions complicating puerperium: Secondary | ICD-10-CM | POA: Diagnosis not present

## 2020-08-04 DIAGNOSIS — O9903 Anemia complicating the puerperium: Secondary | ICD-10-CM | POA: Diagnosis not present

## 2020-08-04 DIAGNOSIS — D259 Leiomyoma of uterus, unspecified: Secondary | ICD-10-CM | POA: Diagnosis present

## 2020-08-04 DIAGNOSIS — O24430 Gestational diabetes mellitus in the puerperium, diet controlled: Secondary | ICD-10-CM | POA: Diagnosis not present

## 2020-08-04 DIAGNOSIS — O3413 Maternal care for benign tumor of corpus uteri, third trimester: Secondary | ICD-10-CM | POA: Diagnosis present

## 2020-08-04 DIAGNOSIS — O24420 Gestational diabetes mellitus in childbirth, diet controlled: Secondary | ICD-10-CM | POA: Diagnosis present

## 2020-08-04 DIAGNOSIS — Z20822 Contact with and (suspected) exposure to covid-19: Secondary | ICD-10-CM | POA: Diagnosis present

## 2020-08-04 DIAGNOSIS — Z3A38 38 weeks gestation of pregnancy: Secondary | ICD-10-CM

## 2020-08-04 DIAGNOSIS — Z3A40 40 weeks gestation of pregnancy: Secondary | ICD-10-CM | POA: Diagnosis not present

## 2020-08-04 DIAGNOSIS — D649 Anemia, unspecified: Secondary | ICD-10-CM | POA: Diagnosis not present

## 2020-08-04 DIAGNOSIS — O09513 Supervision of elderly primigravida, third trimester: Secondary | ICD-10-CM | POA: Diagnosis not present

## 2020-08-04 LAB — COMPREHENSIVE METABOLIC PANEL
ALT: 24 U/L (ref 0–44)
AST: 28 U/L (ref 15–41)
Albumin: 3.2 g/dL — ABNORMAL LOW (ref 3.5–5.0)
Alkaline Phosphatase: 182 U/L — ABNORMAL HIGH (ref 38–126)
Anion gap: 13 (ref 5–15)
BUN: 5 mg/dL — ABNORMAL LOW (ref 6–20)
CO2: 17 mmol/L — ABNORMAL LOW (ref 22–32)
Calcium: 9.5 mg/dL (ref 8.9–10.3)
Chloride: 101 mmol/L (ref 98–111)
Creatinine, Ser: 0.69 mg/dL (ref 0.44–1.00)
GFR, Estimated: >60 mL/min (ref >60)
Glucose, Bld: 101 mg/dL — ABNORMAL HIGH (ref 70–99)
Potassium: 3.8 mmol/L (ref 3.5–5.1)
Sodium: 131 mmol/L — ABNORMAL LOW (ref 135–145)
Total Bilirubin: 2 mg/dL — ABNORMAL HIGH (ref 0.3–1.2)
Total Protein: 7.6 g/dL (ref 6.5–8.1)

## 2020-08-04 LAB — CBC
HCT: 37.5 % (ref 36.0–46.0)
Hemoglobin: 11.2 g/dL — ABNORMAL LOW (ref 12.0–15.0)
MCH: 23.8 pg — ABNORMAL LOW (ref 26.0–34.0)
MCHC: 29.9 g/dL — ABNORMAL LOW (ref 30.0–36.0)
MCV: 79.8 fL — ABNORMAL LOW (ref 80.0–100.0)
Platelets: 311 10*3/uL (ref 150–400)
RBC: 4.7 MIL/uL (ref 3.87–5.11)
RDW: 18.1 % — ABNORMAL HIGH (ref 11.5–15.5)
WBC: 12 10*3/uL — ABNORMAL HIGH (ref 4.0–10.5)
nRBC: 0 % (ref 0.0–0.2)

## 2020-08-04 LAB — GLUCOSE, CAPILLARY
Glucose-Capillary: 93 mg/dL (ref 70–99)
Glucose-Capillary: 90 mg/dL (ref 70–99)
Glucose-Capillary: 86 mg/dL (ref 70–99)

## 2020-08-04 LAB — RPR: RPR Ser Ql: NONREACTIVE

## 2020-08-04 LAB — TYPE AND SCREEN
ABO/RH(D): B POS
Antibody Screen: NEGATIVE

## 2020-08-04 LAB — RESP PANEL BY RT-PCR (FLU A&B, COVID) ARPGX2
Influenza A by PCR: NEGATIVE
Influenza B by PCR: NEGATIVE
SARS Coronavirus 2 by RT PCR: NEGATIVE

## 2020-08-04 MED ORDER — SOD CITRATE-CITRIC ACID 500-334 MG/5ML PO SOLN: 30.0000 mL | ORAL | Status: DC | PRN

## 2020-08-04 MED ORDER — SIMETHICONE 80 MG PO CHEW: 80.0000 mg | CHEWABLE_TABLET | ORAL | Status: DC | PRN

## 2020-08-04 MED ORDER — IBUPROFEN 600 MG PO TABS
600.0000 mg | ORAL_TABLET | Freq: Four times a day (QID) | ORAL | Status: DC
  Administered 2020-08-04 – 2020-08-06 (×6): 600 mg via ORAL
  Filled 2020-08-04 (×6): qty 1

## 2020-08-04 MED ORDER — OXYTOCIN BOLUS FROM INFUSION
333.0000 mL | Freq: Once | INTRAVENOUS | Status: AC
  Administered 2020-08-04: 333 mL via INTRAVENOUS

## 2020-08-04 MED ORDER — DIPHENHYDRAMINE HCL 50 MG/ML IJ SOLN: 12.5000 mg | INTRAMUSCULAR | Status: DC | PRN

## 2020-08-04 MED ORDER — PRENATAL MULTIVITAMIN CH
1.0000 | ORAL_TABLET | Freq: Every day | ORAL | Status: DC
  Administered 2020-08-05: 1 via ORAL
  Filled 2020-08-04: qty 1

## 2020-08-04 MED ORDER — PHENYLEPHRINE 40 MCG/ML (10ML) SYRINGE FOR IV PUSH (FOR BLOOD PRESSURE SUPPORT): 80.0000 ug | PREFILLED_SYRINGE | INTRAVENOUS | Status: DC | PRN

## 2020-08-04 MED ORDER — MEASLES, MUMPS & RUBELLA VAC IJ SOLR: 0.5000 mL | Freq: Once | INTRAMUSCULAR | Status: DC

## 2020-08-04 MED ORDER — FENTANYL-BUPIVACAINE-NACL 0.5-0.125-0.9 MG/250ML-% EP SOLN
12.0000 mL/h | EPIDURAL | Status: DC | PRN
Start: 2020-08-04 — End: 2020-08-04

## 2020-08-04 MED ORDER — DIBUCAINE (PERIANAL) 1 % EX OINT: 1.0000 "application " | TOPICAL_OINTMENT | CUTANEOUS | Status: DC | PRN

## 2020-08-04 MED ORDER — TETANUS-DIPHTH-ACELL PERTUSSIS 5-2.5-18.5 LF-MCG/0.5 IM SUSY: 0.5000 mL | PREFILLED_SYRINGE | Freq: Once | INTRAMUSCULAR | Status: DC

## 2020-08-04 MED ORDER — LACTATED RINGERS IV SOLN: INTRAVENOUS | Status: DC

## 2020-08-04 MED ORDER — ZOLPIDEM TARTRATE 5 MG PO TABS: 5.0000 mg | ORAL_TABLET | Freq: Every evening | ORAL | Status: DC | PRN

## 2020-08-04 MED ORDER — LIDOCAINE HCL (PF) 1 % IJ SOLN: 30.0000 mL | INTRAMUSCULAR | Status: DC | PRN

## 2020-08-04 MED ORDER — BENZOCAINE-MENTHOL 20-0.5 % EX AERO: 1.0000 "application " | INHALATION_SPRAY | CUTANEOUS | Status: DC | PRN

## 2020-08-04 MED ORDER — EPHEDRINE 5 MG/ML INJ: 10.0000 mg | INTRAVENOUS | Status: DC | PRN

## 2020-08-04 MED ORDER — MEDROXYPROGESTERONE ACETATE 150 MG/ML IM SUSP: 150.0000 mg | INTRAMUSCULAR | Status: DC | PRN

## 2020-08-04 MED ORDER — HYDROXYZINE HCL 50 MG PO TABS: 50.0000 mg | ORAL_TABLET | Freq: Four times a day (QID) | ORAL | Status: DC | PRN

## 2020-08-04 MED ORDER — OXYTOCIN-SODIUM CHLORIDE 30-0.9 UT/500ML-% IV SOLN: 1.0000 m[IU]/min | INTRAVENOUS | Status: DC

## 2020-08-04 MED ORDER — SODIUM CHLORIDE (PF) 0.9 % IJ SOLN
INTRAMUSCULAR | Status: DC | PRN
  Administered 2020-08-04: 12 mL/h via EPIDURAL

## 2020-08-04 MED ORDER — SENNOSIDES-DOCUSATE SODIUM 8.6-50 MG PO TABS
2.0000 | ORAL_TABLET | ORAL | Status: DC
  Administered 2020-08-05: 2 via ORAL
  Filled 2020-08-04: qty 2

## 2020-08-04 MED ORDER — LACTATED RINGERS IV SOLN: 500.0000 mL | Freq: Once | INTRAVENOUS | Status: DC

## 2020-08-04 MED ORDER — ONDANSETRON HCL 4 MG/2ML IJ SOLN: 4.0000 mg | Freq: Four times a day (QID) | INTRAMUSCULAR | Status: DC | PRN

## 2020-08-04 MED ORDER — WITCH HAZEL-GLYCERIN EX PADS: 1.0000 "application " | MEDICATED_PAD | CUTANEOUS | Status: DC | PRN

## 2020-08-04 MED ORDER — ACETAMINOPHEN 325 MG PO TABS: 650.0000 mg | ORAL_TABLET | ORAL | Status: DC | PRN

## 2020-08-04 MED ORDER — ONDANSETRON HCL 4 MG/2ML IJ SOLN: 4.0000 mg | INTRAMUSCULAR | Status: DC | PRN

## 2020-08-04 MED ORDER — OXYTOCIN-SODIUM CHLORIDE 30-0.9 UT/500ML-% IV SOLN
2.5000 [IU]/h | INTRAVENOUS | Status: DC
  Administered 2020-08-04: 2.5 [IU]/h via INTRAVENOUS
  Filled 2020-08-04: qty 500

## 2020-08-04 MED ORDER — TERBUTALINE SULFATE 1 MG/ML IJ SOLN: 0.2500 mg | Freq: Once | INTRAMUSCULAR | Status: DC | PRN

## 2020-08-04 MED ORDER — ONDANSETRON HCL 4 MG PO TABS: 4.0000 mg | ORAL_TABLET | ORAL | Status: DC | PRN

## 2020-08-04 MED ORDER — COCONUT OIL OIL: 1.0000 "application " | TOPICAL_OIL | Status: DC | PRN

## 2020-08-04 MED ORDER — FENTANYL CITRATE (PF) 100 MCG/2ML IJ SOLN: 50.0000 ug | INTRAMUSCULAR | Status: DC | PRN

## 2020-08-04 MED ORDER — FENTANYL-BUPIVACAINE-NACL 0.5-0.125-0.9 MG/250ML-% EP SOLN
EPIDURAL | Status: AC
  Filled 2020-08-04: qty 250

## 2020-08-04 MED ORDER — DIPHENHYDRAMINE HCL 25 MG PO CAPS: 25.0000 mg | ORAL_CAPSULE | Freq: Four times a day (QID) | ORAL | Status: DC | PRN

## 2020-08-04 MED ORDER — LACTATED RINGERS IV SOLN: 500.0000 mL | INTRAVENOUS | Status: DC | PRN

## 2020-08-04 MED ORDER — OXYCODONE-ACETAMINOPHEN 5-325 MG PO TABS
1.0000 | ORAL_TABLET | ORAL | Status: DC | PRN
Start: 2020-08-04 — End: 2020-08-04

## 2020-08-04 MED ORDER — FERROUS SULFATE 325 (65 FE) MG PO TABS
325.0000 mg | ORAL_TABLET | ORAL | Status: DC
  Administered 2020-08-05: 325 mg via ORAL
  Filled 2020-08-04: qty 1

## 2020-08-04 MED ORDER — COVID-19 MRNA VACCINE (PFIZER) 30 MCG/0.3ML IM SUSP
0.3000 mL | Freq: Once | INTRAMUSCULAR | Status: AC
  Administered 2020-08-06: 0.3 mL via INTRAMUSCULAR
  Filled 2020-08-04 (×2): qty 0.3

## 2020-08-04 MED ORDER — LIDOCAINE HCL (PF) 1 % IJ SOLN
INTRAMUSCULAR | Status: DC | PRN
  Administered 2020-08-04: 10 mL via EPIDURAL

## 2020-08-04 NOTE — Progress Notes (Signed)
After 1 hr fundal check, I got the patient up to the bathroom because her fundus was at 2/u. Patient walked to the bathroom fine, voided, and performed peri care.  While washing hands, she became dizzy and felt like she was going to pass out.  Help was called and a wheelchair was brought to the patient bathroom. Patient was assisted back to bed. Pt felt better upon getting back into bed. Fundus assessed, firm and at U after voiding, small amount of bleeding, no clots noted. Pt instructed to call for help when she needs to go to the bathroom again.

## 2020-08-04 NOTE — Lactation Note (Signed)
This note was copied from a baby's chart. Lactation Consultation Note  Patient Name: Nicole Lindsey [id number] Date: 08/04/2020 Reason for consult: Follow-up assessment;1st time breastfeeding;Early term 37-38.6wks Age:35 hours P1, ETI female infant. Interpreter used Cedric # [id number]   Per mom, she doesn't have pump at home, [place] explained mom has been given  hand pump in DEBP kit to take home. LC changed large meconium stool while in room, infant's 2nd stool no voids yet. Per mom, infant not latching well, LC assisted with latch, infant only held nipple in mouth, LC tried to have  infant sucking on LC's gloved finger infant did not suckle only tongue thrust. Mom concern she doesn't have milk wants formula, LC discussed with parents we also have donor breast milk, parents choose  donor breast milk to supplement infant , LEAD discussed. LC discussed hand expression and assisted mom with hand expression but colostrum wasn't present, per mom, she saw little breast changes in her pregnancy. Infant was given 6 mls of donor breast milk by spoon. Infant had medium emesis ( clear mucus) LC suction infant with bulb syringe.  Mom understands to BF infant by cues, 8 to 12+ times within 24 hours, STS. LC discussed infant's  input and output with parents  Mom made aware of O/P services, breastfeeding support groups, community resources, and our phone # for post-discharge questions.  Mom's current plan first 24 hours : 1-Mom will continue to work on latching infant at the breast and will ask RN or [place] for assistance with latching infant at the breast. 2-Mom will do lots of STS with infant. 3-Mom will supplement infant with 5-7 mls of donor breast milk after latching infant at the breast until mom's colostrum is present. 4-Mom will use DEBP every 3 hours for 15 minutes on initial setting.   Maternal Data Formula Feeding for Exclusion: No Has patient been taught Hand Expression?: Yes Does the patient have  breastfeeding experience prior to this delivery?: No  Feeding Feeding Type: Breast Fed  LATCH Score Latch: Repeated attempts needed to sustain latch, nipple held in mouth throughout feeding, stimulation needed to elicit sucking reflex.  Audible Swallowing: None  Type of Nipple: Everted at rest and after stimulation  Comfort (Breast/Nipple): Soft / non-tender  Hold (Positioning): Assistance needed to correctly position infant at breast and maintain latch.  LATCH Score: 6  Interventions Interventions: Breast feeding basics reviewed;Assisted with latch;Skin to skin;Breast massage;Hand express;Expressed milk;Position options;Support pillows;Adjust position;Breast compression;DEBP  Lactation Tools Discussed/Used Tools: Pump Breast pump type: Double-Electric Breast Pump WIC Program: No Pump Education: Setup, frequency, and cleaning;Milk Storage Initiated by:: Vicente Serene, IBCLC Date initiated:: 08/04/20   Consult Status Consult Status: Follow-up Date: 08/05/20 Follow-up type: In-patient    Vicente Serene 08/04/2020, 11:40 PM

## 2020-08-04 NOTE — Lactation Note (Signed)
This note was copied from a baby's chart. Lactation Consultation Note  Patient Name: Nicole Lindsey Date: 08/04/2020   Age:35 hours LC attempted to see mom.  3 RN's in room.  Mom almost passed out in the restroom and is not feeling well.  They reported it was best fpr her  to be seen later Maternal Data    Feeding Feeding Type: Breast Fed  LATCH Score Latch: Grasps breast easily, tongue down, lips flanged, rhythmical sucking.  Audible Swallowing: Spontaneous and intermittent  Type of Nipple: Everted at rest and after stimulation  Comfort (Breast/Nipple): Soft / non-tender  Hold (Positioning): No assistance needed to correctly position infant at breast.  LATCH Score: 10  Interventions    Lactation Tools Discussed/Used     Consult Status      [name] Thompson Caul 08/04/2020, 5:03 PM

## 2020-08-04 NOTE — Discharge Instructions (Signed)

## 2020-08-04 NOTE — H&P (Signed)
Nicole Lindsey is a 35 y.o. female presenting for painful contractions Was seen yesterday and cervix was closed and thin.  She has been followed at [facility] and her pregnancy has been remarkable for GDM and uterine fibroids. Was scheduled for IOL at 40 weeks.   . OB History    Gravida  1   Para  0   Term  0   Preterm  0   AB  0   Living  0     SAB  0   IAB  0   Ectopic  0   Multiple  0   Live Births  0          Past Medical History:  Diagnosis Date  . Fibroid   . Gestational diabetes   . Medical history non-contributory    Past Surgical History:  Procedure Laterality Date  . NO PAST SURGERIES     Family History: family history is not on file. Social History:  reports that she has never smoked. She has never used smokeless tobacco. She reports previous alcohol use. She reports previous drug use.     Maternal Diabetes: Yes:  Diabetes Type:  Diet controlled Genetic Screening: Normal Maternal Ultrasounds/Referrals: Normal Fetal Ultrasounds or other Referrals:  None Maternal Substance Abuse:  No Significant Maternal Medications:  None Significant Maternal Lab Results:  Group B Strep negative Other Comments:  None  Review of Systems  Constitutional: Negative for chills and fever.  Respiratory: Negative for shortness of breath.   Gastrointestinal: Positive for abdominal pain.  Genitourinary: Positive for pelvic pain. Negative for vaginal bleeding and vaginal discharge.  Neurological: Negative for headaches.   Maternal Medical History:  Reason for admission: Contractions.   Contractions: Onset was 13-24 hours ago.   Frequency: regular.   Perceived severity is strong.    Fetal activity: Perceived fetal activity is normal.   Last perceived fetal movement was within the past hour.    Prenatal complications: No bleeding, PIH, placental abnormality, pre-eclampsia or preterm labor.   Prenatal Complications - Diabetes: gestational. Diabetes is managed by diet.         Blood pressure 128/84, pulse 97, temperature 97.6 F (36.4 C), temperature source Oral, resp. rate 20, last menstrual period 11/09/2019, SpO2 99 %. Maternal Exam:  Uterine Assessment: Contraction strength is firm.  Contraction frequency is regular.   Abdomen: Patient reports no abdominal tenderness. Fetal presentation: vertex  Introitus: Normal vulva. Normal vagina.  Ferning test: not done.  Nitrazine test: not done. Amniotic fluid character: not assessed.  Pelvis: adequate for delivery.   Cervix: Cervix evaluated by digital exam.     Fetal Exam Fetal Monitor Review: Mode: ultrasound.   Baseline rate: 140.  Variability: moderate (6-25 bpm).   Pattern: accelerations present and no decelerations.    Fetal State Assessment: Category I - tracings are normal.     Physical Exam Constitutional:      General: She is not in acute distress.    Appearance: Normal appearance. She is not ill-appearing or toxic-appearing.  HENT:     Head: Normocephalic.  Cardiovascular:     Rate and Rhythm: Normal rate.  Pulmonary:     Effort: Pulmonary effort is normal. No respiratory distress.  Abdominal:     Tenderness: There is no abdominal tenderness. There is no guarding or rebound.  Genitourinary:    General: Normal vulva.     Comments:  6cm  Musculoskeletal:        General: Normal range of motion.  Cervical back: Normal range of motion.  Skin:    General: Skin is warm and dry.  Neurological:     General: No focal deficit present.     Mental Status: She is alert.  Psychiatric:        Mood and Affect: Mood normal.     Prenatal labs: ABO, Rh: B/Positive/-- (08/16 1720) Antibody: Negative (08/16 1720) Rubella: 15.70 (08/16 1720) RPR: Non Reactive (11/09 0815)  HBsAg: Negative (08/16 1720)  HIV: Non Reactive (11/09 0815)  GBS: Negative/-- (12/30 1510)   Assessment/Plan: Single IUP at [redacted]w[redacted]d Active Labor Gestational Diabetes, well controlled Language  [place]  Admit to Labor and Delivery Routine orders Epidural analgesia Anticipate SVD   Hansel Feinstein 08/04/2020, 1:00 AM

## 2020-08-04 NOTE — Progress Notes (Signed)
Patient ID: Nicole Lindsey, female   DOB: Jul 04, 1986, 35 y.o.   MRN: [id number] Doing well  AVSS  FHR reassuring Category I UCs q 81min  Dilation: 7 Effacement (%): 100 Station: 0 Presentation: Vertex Exam by:: Hansel Feinstein, CNM  AROM forebag  Recommended Pitocin augmentation Patient does not want to use that at this time

## 2020-08-04 NOTE — Anesthesia Procedure Notes (Signed)
Epidural Patient location during procedure: OB Start time: 08/04/2020 2:58 AM End time: 08/04/2020 3:08 AM  Staffing Anesthesiologist: Merlinda Frederick, MD Performed: anesthesiologist   Preanesthetic Checklist Completed: patient identified, IV checked, site marked, risks and benefits discussed, monitors and equipment checked, pre-op evaluation and timeout performed  Epidural Patient position: sitting Prep: DuraPrep Patient monitoring: heart rate, cardiac monitor, continuous pulse ox and blood pressure Approach: midline Location: L2-L3 Injection technique: LOR saline  Needle:  Needle type: Tuohy  Needle gauge: 17 G Needle length: 9 cm Needle insertion depth: 6 cm Catheter type: closed end flexible Catheter size: 20 Guage Catheter at skin depth: 11 cm Test dose: negative and Other  Assessment Events: blood not aspirated, injection not painful, no injection resistance and negative IV test  Additional Notes Informed consent obtained prior to proceeding including risk of failure, 1% risk of PDPH, risk of minor discomfort and bruising.  Discussed rare but serious complications including epidural abscess, permanent nerve injury, epidural hematoma.  Discussed alternatives to epidural analgesia and patient desires to proceed.  Timeout performed pre-procedure verifying patient name, procedure, and platelet count.  Patient tolerated procedure well. 3 attempts as patient had significant difficulty getting into position due to pain and due to language barrier. Patient was from [place] and phone interpreter was being used on speakerphone with poor sound quality. 3rd attempt succesful, catheter threaded easily. Norton Blizzard, MD

## 2020-08-04 NOTE — Discharge Summary (Signed)
Postpartum Discharge Summary     Patient Name: Nicole Lindsey DOB: 09/11/1985 MRN: [id number]  Date of admission: 08/04/2020 Delivery date:08/04/2020  Delivering provider: [name] Lindsey  Date of discharge: 08/06/2020  Admitting diagnosis: Diet controlled gestational diabetes mellitus (GDM) in third trimester [O24.410] Intrauterine pregnancy: [redacted]w[redacted]d    Secondary diagnosis:  Active Problems:   Uterine fibroids affecting pregnancy in third trimester   Advanced maternal age, 1st pregnancy   Non-English speaking patient   Diet controlled gestational diabetes mellitus (GDM) in third trimester   Anemia due to acute blood loss  Additional problems: none    Discharge diagnosis: Term Pregnancy Delivered and GDM A1                                              Post partum procedures:none Augmentation: AROM and Pitocin Complications: None  Hospital course: Onset of Labor With Vaginal Delivery      [name] at 313w3das admitted in Active Labor on 08/04/2020. Patient had an uncomplicated labor course as follows, receiving an epidural upon arrival to L&D, then having AROM prior to becoming complete. Pitocin was started in second stage to facilitate pushing.  Membrane Rupture Time/Date: 6:18 AM ,08/04/2020   Delivery Method:Vaginal, Spontaneous  Episiotomy: None  Lacerations:  Sulcus;1st degree  Patient had a postpartum course complicated by anemia. Hgb 7.7. Venofer given. Pt ambulating w.out dizziness.  She is ambulating, tolerating a regular diet, passing flatus, and urinating well. Patient is discharged home in stable condition on 08/06/20.  Newborn Data: Birth date:08/04/2020  Birth time:1:04 PM  Gender:Female  Living status:Living  Apgars:9 ,10  Weight:3600 g (7lb 15oz)  Magnesium Sulfate received: No BMZ received: No Rhophylac:N/A MMR:N/A T-DaP:Given prenatally Flu: Yes Transfusion:No  Physical exam  Vitals:   08/05/20 0000 08/05/20 0400 08/05/20 1528 08/06/20 0000   BP: 101/65 103/62 110/65 (!) 97/51  Pulse: 75 89 92 69  Resp: _0 Temp: 97.6 F (36.4 C) 97.7 F (36.5 C) 98 F (36.7 C) 98 F (36.7 C)  TempSrc: Oral Oral Oral Oral  SpO2: 100% 100% 100% 100%  Weight:      Height:       General: alert, cooperative and no distress Lochia: appropriate Uterine Fundus: firm Incision: N/A DVT Evaluation: No evidence of DVT seen on physical exam. Labs: Lab Results  Component Value Date   WBC 21.2 (H) 08/05/2020   HGB 7.7 (L) 08/05/2020   HCT 25.0 (L) 08/05/2020   MCV 78.9 (L) 08/05/2020   PLT 306 08/05/2020   CMP Latest Ref Rng & Units 08/04/2020  Glucose 70 - 99 mg/dL 101(H)  BUN 6 - 20 mg/dL 5(L)  Creatinine 0.44 - 1.00 mg/dL 0.69  Sodium 135 - 145 mmol/L 131(L)  Potassium 3.5 - 5.1 mmol/L 3.8  Chloride 98 - 111 mmol/L 101  CO2 22 - 32 mmol/L 17(L)  Calcium 8.9 - 10.3 mg/dL 9.5  Total Protein 6.5 - 8.1 g/dL 7.6  Total Bilirubin 0.3 - 1.2 mg/dL 2.0(H)  Alkaline Phos 38 - 126 U/L 182(H)  AST 15 - 41 U/L 28  ALT 0 - 44 U/L 24   Edinburgh Score: Edinburgh Postnatal Depression Scale Screening Tool 08/06/2020  I have been able to laugh and see the funny side of things. 0  I have looked forward with enjoyment to things.  0  I have blamed myself unnecessarily when things went wrong. 0  I have been anxious or worried for no good reason. 0  I have felt scared or panicky for no good reason. 0  Things have been getting on top of me. 0  I have been so unhappy that I have had difficulty sleeping. 0  I have felt sad or miserable. 0  I have been so unhappy that I have been crying. 0  The thought of harming myself has occurred to me. 0  Edinburgh Postnatal Depression Scale Total 0     After visit meds:  Allergies as of 08/06/2020   No Known Allergies     Medication List    STOP taking these medications   acetaminophen 650 MG CR tablet Commonly known as: Tylenol 8 Hour   famotidine 40 MG tablet Commonly known as: Pepcid    terconazole 0.8 % vaginal cream Commonly known as: TERAZOL 3     TAKE these medications   ferrous sulfate 325 (65 FE) MG tablet Take 1 tablet (325 mg total) by mouth every other day. Start taking on: August 07, 2020   ibuprofen 600 MG tablet Commonly known as: ADVIL Take 1 tablet (600 mg total) by mouth every 6 (six) hours as needed for fever, headache, moderate pain or cramping.   multivitamin-prenatal 27-0.8 MG Tabs tablet Take 1 tablet by mouth daily at 12 noon.        Discharge home in stable condition Infant Feeding: Breast Infant Disposition:home with mother Discharge instruction: per After Visit Summary and Postpartum booklet. Activity: Advance as tolerated. Pelvic rest for 6 weeks.  Diet: routine diet Future Appointments: Future Appointments  Date Time Provider [place]  08/08/2020 10:00 AM [facility]-SCREENING [facility] None   Massage sent to [place] [facility] to schedule PP appointment.   Follow up Visit:  [place] for [place] at [facility] Follow up in 4 week(Lindsey).   Specialty: Obstetrics and Gynecology Why: will call you to schedule postpartum visit Contact information: [address] [place] [id number] [place] [facility] Follow up.   Specialty: Obstetrics and Gynecology Why: as needed in pregnancy-related emergencies Contact information: [address] [phone] [place]              Nicole Lindsey, CNM  P [facility] Admin Pool Please schedule this patient for Postpartum visit in: 4 weeks with the following provider: Any provider  In-Person  For C/Lindsey patients schedule nurse incision check in weeks 2 weeks: no  High risk pregnancy complicated by: [email or web address]  Delivery mode: SVD  Anticipated Birth Control: NFP  PP Procedures needed: 2 hour GTT  Schedule Integrated BH visit: no     08/06/2020 Nicole Lindsey, CNM

## 2020-08-04 NOTE — Progress Notes (Signed)
Nicole Lindsey is a 35 y.o. G1P0000 at [redacted]w[redacted]d by LMP admitted for spontaneous onset of labor, with painful contractions.  Subjective: Patient is comfortable with epidural in place. Denies rectal and vaginal pressure.  Objective: BP (!) 117/59   Pulse 83   Temp 98.2 F (36.8 C) (Oral)   Resp 18   Ht 5\' 8"  (1.727 m)   Wt 91.2 kg   LMP 11/09/2019   SpO2 100%   BMI 30.56 kg/m  No intake/output data recorded. No intake/output data recorded.  FHT:  FHR: 140 bpm, variability: moderate,  accelerations:  Present,  decelerations:  Absent UC:   regular, every 3-5 minutes SVE:   Dilation: 10 Effacement (%): 100 Station: Plus 2 Exam by:: Jencarlos Nicolson,snm  Labs: Lab Results  Component Value Date   WBC 12.0 (H) 08/04/2020   HGB 11.2 (L) 08/04/2020   HCT 37.5 08/04/2020   MCV 79.8 (L) 08/04/2020   PLT 311 08/04/2020    Assessment / Plan: Spontaneous labor progressing slowly, pitocin discussed and started using shared decision making  Labor: Pitocin started to increase frequency and strength of contractions. Patient complete at this time, but unable to push. Patient to labor down for 1 hour.  Preeclampsia:  n/a Fetal Wellbeing:  Category I Pain Control:  Epidural I/D:  GBS Negative Anticipated MOD:  NSVD  Valli Glance 08/04/2020, 10:00 AM

## 2020-08-04 NOTE — Anesthesia Preprocedure Evaluation (Addendum)
Anesthesia Evaluation  Patient identified by MRN, date of birth, ID band Patient awake    Reviewed: Allergy & Precautions, H&P , NPO status , Patient's Chart, lab work & pertinent test results  Airway Mallampati: II  TM Distance: >3 FB Neck ROM: Full    Dental no notable dental hx.    Pulmonary neg pulmonary ROS,    Pulmonary exam normal breath sounds clear to auscultation       Cardiovascular negative cardio ROS Normal cardiovascular exam Rhythm:Regular Rate:Normal     Neuro/Psych negative neurological ROS  negative psych ROS   GI/Hepatic negative GI ROS, Neg liver ROS,   Endo/Other  diabetes, Gestational  Renal/GU negative Renal ROS  negative genitourinary   Musculoskeletal negative musculoskeletal ROS (+)   Abdominal   Peds negative pediatric ROS (+)  Hematology negative hematology ROS (+)   Anesthesia Other Findings   Reproductive/Obstetrics (+) Pregnancy                             Anesthesia Physical Anesthesia Plan  ASA: III  Anesthesia Plan: Epidural   Post-op Pain Management:    Induction:   PONV Risk Score and Plan:   Airway Management Planned:   Additional Equipment:   Intra-op Plan:   Post-operative Plan:   Informed Consent: I have reviewed the patients History and Physical, chart, labs and discussed the procedure including the risks, benefits and alternatives for the proposed anesthesia with the patient or authorized representative who has indicated his/her understanding and acceptance.     Interpreter used for [employer] Discussed with: Anesthesiologist  Anesthesia Plan Comments:        Anesthesia Quick Evaluation

## 2020-08-04 NOTE — MAU Note (Signed)
contractions 

## 2020-08-05 LAB — CBC
HCT: 25 % — ABNORMAL LOW (ref 36.0–46.0)
Hemoglobin: 7.7 g/dL — ABNORMAL LOW (ref 12.0–15.0)
MCH: 24.3 pg — ABNORMAL LOW (ref 26.0–34.0)
MCHC: 30.8 g/dL (ref 30.0–36.0)
MCV: 78.9 fL — ABNORMAL LOW (ref 80.0–100.0)
Platelets: 306 10*3/uL (ref 150–400)
RBC: 3.17 MIL/uL — ABNORMAL LOW (ref 3.87–5.11)
RDW: 18.2 % — ABNORMAL HIGH (ref 11.5–15.5)
WBC: 21.2 10*3/uL — ABNORMAL HIGH (ref 4.0–10.5)
nRBC: 0 % (ref 0.0–0.2)

## 2020-08-05 LAB — GLUCOSE, CAPILLARY: Glucose-Capillary: 117 mg/dL — ABNORMAL HIGH (ref 70–99)

## 2020-08-05 MED ORDER — SODIUM CHLORIDE 0.9 % IV SOLN
500.0000 mg | Freq: Once | INTRAVENOUS | Status: AC
  Administered 2020-08-05: 500 mg via INTRAVENOUS
  Filled 2020-08-05: qty 25

## 2020-08-05 NOTE — Progress Notes (Signed)
Post Partum Day 1 following SOL Subjective: no complaints, up ad lib, voiding, tolerating PO and + flatus. Her baby is currently in NICU  Objective: Blood pressure 103/62, pulse 89, temperature 97.7 F (36.5 C), temperature source Oral, resp. rate 16, height 5\' 8"  (1.727 m), weight 91.2 kg, last menstrual period 11/09/2019, SpO2 100 %, unknown if currently breastfeeding.  Physical Exam:  General: alert, cooperative and no distress Lochia: appropriate Uterine Fundus: firm DVT Evaluation: No evidence of DVT seen on physical exam. No cords or calf tenderness. No significant calf/ankle edema.  Recent Labs    08/04/20 0136  HGB 11.2*  HCT 37.5    Assessment/Plan: Plan for discharge tomorrow, Breastfeeding and Contraception None. Patient elects to do natural planning for birth control.    LOS: 1 day   Jake Michaelis 08/05/2020, 7:45 AM

## 2020-08-05 NOTE — Anesthesia Postprocedure Evaluation (Signed)
Anesthesia Post Note  Patient: Nicole Lindsey  Procedure(s) Performed: AN AD [place]     Patient location during evaluation: Mother Baby Anesthesia Type: Epidural Level of consciousness: awake and alert Pain management: pain level controlled Vital Signs Assessment: post-procedure vital signs reviewed and stable Respiratory status: spontaneous breathing, nonlabored ventilation and respiratory function stable Cardiovascular status: stable Postop Assessment: no headache, no backache and epidural receding Anesthetic complications: no   No complications documented.  Last Vitals:  Vitals:   08/05/20 0000 08/05/20 0400  BP: 101/65 103/62  Pulse: 75 89  Resp: 16 16  Temp: 36.4 C 36.5 C  SpO2: 100% 100%    Last Pain:  Vitals:   08/05/20 0400  TempSrc: Oral  PainSc: 0-No pain   Pain Goal:                   Rayvon Char

## 2020-08-05 NOTE — Lactation Note (Signed)
This note was copied from a baby's chart. Lactation Consultation Note  Patient Name: Nicole Lindsey Date: 08/05/2020  Mom attempting to breastfeed on arrival.  Infant sleepy .  Mom reports she has no milk.  Unable to hand express any on moms right breast at this time.  Able to see a glistening on the left.Assisted with feeding in laid back breastfeeding.  Infant latches and comes off and on.  After a few minutes she settled and latched well with rythmic sucking.  Urged mom to offer the breast first, then pump for 15 minutes and follow up with any breasts milk and formula as recommended or as mom felt necessary.  Praised breastfeeding.  Urged to call lactation as needed. Age:66 hours  Maternal Data    Feeding    LATCH Score                   Interventions    Lactation Tools Discussed/Used     Consult Status      Nicole Lindsey 08/05/2020, 7:43 PM

## 2020-08-06 DIAGNOSIS — D62 Acute posthemorrhagic anemia: Secondary | ICD-10-CM | POA: Diagnosis not present

## 2020-08-06 DIAGNOSIS — O99893 Other specified diseases and conditions complicating puerperium: Secondary | ICD-10-CM

## 2020-08-06 DIAGNOSIS — O24430 Gestational diabetes mellitus in the puerperium, diet controlled: Secondary | ICD-10-CM

## 2020-08-06 DIAGNOSIS — D649 Anemia, unspecified: Secondary | ICD-10-CM

## 2020-08-06 DIAGNOSIS — O9903 Anemia complicating the puerperium: Secondary | ICD-10-CM

## 2020-08-06 MED ORDER — IBUPROFEN 600 MG PO TABS: 600.0000 mg | ORAL_TABLET | Freq: Four times a day (QID) | ORAL | 1 refills | Status: DC | PRN

## 2020-08-06 MED ORDER — FERROUS SULFATE 325 (65 FE) MG PO TABS: 325.0000 mg | ORAL_TABLET | ORAL | 3 refills | Status: DC

## 2020-08-06 NOTE — Lactation Note (Signed)
This note was copied from a baby's chart. Lactation Consultation Note  Patient Name: Girl Pallas Wahlert [id number] Date: 08/06/2020 Reason for consult: Follow-up assessment Age:35 hours   P1 mother whose infant is now 84 hours old.  This is an ETI at 38+3 weeks.  Mother's feeding preference is breast/bottle.  Hospital staff members in room with mother when I arrived.  Ipad in use with mother's preferred language.  Mother had no questions/concerns related to breast feeding.  She prefers to exclusively breast feed, however, is concerned that baby is not "getting enough."  She is supplementing with formula and I expressed that this is okay, especially due to baby being an ETI.  Discussed "supply and demand" and asked mother to continue to put baby to breast prior to any formula supplementation.  Supplementation volumes should be 30 + mls after discharge.  Mother plans to continue supplementing.    Engorgement prevention/treatment reviewed.  Mother has a manual pump for home use.  She is not interested in obtaining a DEBP.  She has our OP phone number for any concerns after discharge.  Discussed storing and warming breast milk.  Answered mother's questions.  No support person present at this time.  Baby has been discharged per NP.   Maternal Data    Feeding Feeding Type: Breast Fed  LATCH Score                   Interventions    Lactation Tools Discussed/Used     Consult Status Consult Status: Complete Date: 08/06/20 Follow-up type: Call as needed    Aracelys Glade R Yaquelin Langelier 08/06/2020, 9:25 AM

## 2020-08-07 ENCOUNTER — Encounter: Payer: Self-pay | Admitting: Family Medicine

## 2020-08-08 ENCOUNTER — Other Ambulatory Visit (HOSPITAL_COMMUNITY): Payer: Self-pay

## 2020-08-09 ENCOUNTER — Encounter: Payer: Self-pay | Admitting: Family Medicine

## 2020-08-10 ENCOUNTER — Inpatient Hospital Stay (HOSPITAL_COMMUNITY): Payer: Self-pay

## 2020-08-10 ENCOUNTER — Inpatient Hospital Stay (HOSPITAL_COMMUNITY): Admission: AD | Admit: 2020-08-10 | Payer: Self-pay | Source: Home / Self Care | Admitting: Obstetrics and Gynecology

## 2020-08-18 ENCOUNTER — Other Ambulatory Visit: Payer: Self-pay | Admitting: Advanced Practice Midwife

## 2020-08-28 ENCOUNTER — Other Ambulatory Visit: Payer: Self-pay | Admitting: Obstetrics and Gynecology

## 2020-08-28 DIAGNOSIS — O26892 Other specified pregnancy related conditions, second trimester: Secondary | ICD-10-CM

## 2020-09-01 ENCOUNTER — Other Ambulatory Visit: Payer: Self-pay | Admitting: *Deleted

## 2020-09-01 DIAGNOSIS — Z8632 Personal history of gestational diabetes: Secondary | ICD-10-CM

## 2020-09-05 ENCOUNTER — Other Ambulatory Visit: Payer: Self-pay

## 2020-09-05 ENCOUNTER — Encounter: Payer: Self-pay | Admitting: Family Medicine

## 2020-09-05 ENCOUNTER — Ambulatory Visit (INDEPENDENT_AMBULATORY_CARE_PROVIDER_SITE_OTHER): Payer: Medicaid Other | Admitting: Family Medicine

## 2020-09-05 DIAGNOSIS — Z30013 Encounter for initial prescription of injectable contraceptive: Secondary | ICD-10-CM

## 2020-09-05 MED ORDER — POLYETHYLENE GLYCOL 3350 17 GM/SCOOP PO POWD: 17.0000 g | Freq: Every day | ORAL | 1 refills | Status: DC | PRN

## 2020-09-05 MED ORDER — MEDROXYPROGESTERONE ACETATE 150 MG/ML IM SUSP
150.0000 mg | Freq: Once | INTRAMUSCULAR | Status: AC
  Administered 2020-09-05: 150 mg via INTRAMUSCULAR

## 2020-09-05 NOTE — Progress Notes (Signed)
[place] Partum Visit Note  Nicole Lindsey is a 35 y.o. G34P1001 female who presents for a postpartum visit. She is 4 weeks postpartum following a normal spontaneous vaginal delivery.  I have fully reviewed the prenatal and intrapartum course. The delivery was at 38/3 gestational weeks.  Anesthesia: epidural. Postpartum course has been uneventful. Baby is doing well. Baby is feeding by breast. Bleeding no bleeding. Bowel function is abnormal: painful. Bladder function is normal. Patient is not sexually active. Contraception method is none. Postpartum depression screening: negative.   The pregnancy intention screening data noted above was reviewed. Potential methods of contraception were discussed. The patient elected to proceed with Hormonal Injection.    Edinburgh Postnatal Depression Scale - 09/05/20 0842      Edinburgh Postnatal Depression Scale:  In the Past 7 Days   I have been able to laugh and see the funny side of things. 0    I have looked forward with enjoyment to things. 0    I have blamed myself unnecessarily when things went wrong. 0    I have been anxious or worried for no good reason. 0    I have felt scared or panicky for no good reason. 0    Things have been getting on top of me. 0    I have been so unhappy that I have had difficulty sleeping. 0    I have felt sad or miserable. 0    I have been so unhappy that I have been crying. 0    The thought of harming myself has occurred to me. 0    Edinburgh Postnatal Depression Scale Total 0            The following portions of the patient's history were reviewed and updated as appropriate: allergies, current medications, past family history, past medical history, past social history, past surgical history and problem list.  Review of Systems Pertinent items noted in HPI and remainder of comprehensive ROS otherwise negative.    Objective:  BP 125/84   Pulse 78   Ht 5\' 9"  (1.753 m)   Wt 183 lb 11.2 oz (83.3 kg)   LMP  11/09/2019   Breastfeeding Yes   BMI 27.13 kg/m    General:  alert, cooperative and appears stated age  Lungs: comfortable on room air   Vulva:  normal  Vagina: not evaluated  Cervix:  not evaluated  Rectal Exam: no external hemorrhoids visible        Assessment:    Normal postpartum exam. Pap smear not done at today's visit.   Plan:   Essential components of care per ACOG recommendations:  1.  Mood and well being: Patient with negative depression screening today. Reviewed local resources for support.  - Patient does not use tobacco.  - hx of drug use? No    2. Infant care and feeding:  -Patient currently breastmilk feeding? Yes  -Social determinants of health (SDOH) reviewed in EPIC. No concerns  3. Sexuality, contraception and birth spacing - Patient does not want a pregnancy in the next year.  Desired family size is unsure - Reviewed forms of contraception in tiered fashion. Patient desired Depo-Provera today, injection given today.   - Discussed birth spacing of 18 months  4. Sleep and fatigue -Encouraged family/partner/community support of 4 hrs of uninterrupted sleep to help with mood and fatigue  5. Physical Recovery  - Discussed patients delivery and complications - Patient had a 1st degree laceration, perineal healing reviewed. Patient  expressed understanding - Patient has urinary incontinence? No - Patient is safe to resume physical and sexual activity  6.  Health Maintenance - Last pap smear done 01/2018 and was normal, encouraged to return to [facility] for repeat this summer - not fasting today, will reschedule 2hr GTT, emphasized importance - reports some hemorrhoid symptoms, rx for miralax  Clarnce Flock, MD [facility] [place]

## 2020-09-05 NOTE — Patient Instructions (Signed)

## 2020-09-11 ENCOUNTER — Other Ambulatory Visit: Payer: Self-pay

## 2020-09-11 DIAGNOSIS — O24410 Gestational diabetes mellitus in pregnancy, diet controlled: Secondary | ICD-10-CM

## 2020-09-12 LAB — GLUCOSE TOLERANCE, 2 HOURS
Glucose, 2 hour: 97 mg/dL (ref 65–139)
Glucose, GTT - Fasting: 86 mg/dL (ref 65–99)

## 2020-12-12 ENCOUNTER — Other Ambulatory Visit: Payer: Self-pay

## 2020-12-12 ENCOUNTER — Ambulatory Visit (INDEPENDENT_AMBULATORY_CARE_PROVIDER_SITE_OTHER): Payer: Medicaid Other

## 2020-12-12 ENCOUNTER — Encounter: Payer: Self-pay | Admitting: Family Medicine

## 2020-12-12 VITALS — BP 104/72 | HR 83 | Wt 179.7 lb

## 2020-12-12 DIAGNOSIS — Z3042 Encounter for surveillance of injectable contraceptive: Secondary | ICD-10-CM

## 2020-12-12 LAB — POCT PREGNANCY, URINE: Preg Test, Ur: NEGATIVE

## 2020-12-12 MED ORDER — MEDROXYPROGESTERONE ACETATE 150 MG/ML IM SUSP
150.0000 mg | Freq: Once | INTRAMUSCULAR | Status: AC
  Administered 2020-12-12: 150 mg via INTRAMUSCULAR

## 2020-12-12 NOTE — Progress Notes (Signed)
Had baby 08/04/20. Pt states no cycle started back since then. Will run UPT today. UPT today was negative. Pt given results.  Pt states would like to continue Depo Provera injection. Pt missed 10-12 week window from May 17th. Pt would like to get injection today. Pt denies any problems with Depo.  Melinda Crutch here for Depo-Provera Injection. Injection administered without complication. Patient will return in 3 months for next injection between Aug 9 and Aug 23rd. Next annual visit due in July at [facility] for pap smear. Pt aware and agreeable to plan of care.    Georgia Lopes, RN 12/12/2020  4:13 PM

## 2020-12-12 NOTE — Progress Notes (Signed)
Chart reviewed for nurse visit. Agree with plan of care.   Clarnce Flock, MD 12/12/2020 4:44 PM

## 2020-12-14 NOTE — Progress Notes (Signed)
   [place] in person interpreter present for entire encounter: Nicole Lindsey   Subjective:    Patient ID: Nicole Lindsey, female    DOB: 1986-05-16, 35 y.o.   MRN: [id number]   CC: Establish care  HPI:  Nicole Lindsey is a very pleasant 35 y.o. female who presents today to establish care.  Initial concerns:   Burning in throat Started in pregnancy. Has associate epigastric discomfort. Was taking famotidine in pregnancy which was helping; not taking now. Associated with certain food. Does not radiate to RUQ but sometimes feels it in the back. Denies nausea, vomiting, bloody stools.   Past medical history: Anemia (acute blood loss in pregnancy), Acid reflux  Past surgical history: Denies  Current medications: Ferrous sulfate, depo-provera 12/12/2020  Family history: Denies fam hx of high blood pressure, diabetes, heart disease, cancer.  Social history: [place] Face; has 1 daughter born via vaginal deliver 07/2020; is married; denies alcohol or drug use  ROS: pertinent noted in the HPI   Objective:  BP 100/75   Pulse 73   Ht 6' (1.829 m)   Wt 186 lb (84.4 kg)   SpO2 99%   BMI 25.23 kg/m   Vitals and nursing note reviewed  General: NAD, pleasant, able to participate in exam Cardiac: RRR, S1 S2 present. normal heart sounds, no murmurs. Respiratory: CTAB, normal effort, No wheezes, rales or rhonchi Abdomen: Bowel sounds present, non-tender, non-distended, no hepatosplenomegaly Extremities: no edema or cyanosis. Skin: warm and dry, no rashes noted Neuro: alert, no obvious focal deficits Psych: Normal affect and mood  Assessment & Plan:  Encounter to establish care Doing well. Concerns address below. Records release signed to obtain recent Pap from [facility]. -Schedule pap when recent pap available -F/u PRN  Anemia, unspecified type Vaginal delivery 4 months prior with acute blood loss. Given venofer for hgb 7.7. Asymptomatic today. Hgb 11.2 today -Discontinue iron supplement if  desired  Gastroesophageal reflux disease, unspecified whether esophagitis present Hx of acid reflux. Will give H2 blocker today. Also consider ulcer v. Pancreatitis also exam is reassuring today. - famotidine (PEPCID) 20 MG tablet; Take 1 tablet (20 mg total) by mouth daily.  Dispense: 30 tablet; Refill: 2 -F/u if symptoms persist or worsen  Gerlene Fee, [place] PGY-2

## 2020-12-15 ENCOUNTER — Ambulatory Visit (INDEPENDENT_AMBULATORY_CARE_PROVIDER_SITE_OTHER): Payer: Medicaid Other | Admitting: Family Medicine

## 2020-12-15 ENCOUNTER — Other Ambulatory Visit: Payer: Self-pay

## 2020-12-15 ENCOUNTER — Encounter: Payer: Self-pay | Admitting: Family Medicine

## 2020-12-15 VITALS — BP 100/75 | HR 73 | Ht 72.0 in | Wt 186.0 lb

## 2020-12-15 DIAGNOSIS — D649 Anemia, unspecified: Secondary | ICD-10-CM | POA: Diagnosis not present

## 2020-12-15 DIAGNOSIS — Z7689 Persons encountering health services in other specified circumstances: Secondary | ICD-10-CM

## 2020-12-15 DIAGNOSIS — K219 Gastro-esophageal reflux disease without esophagitis: Secondary | ICD-10-CM

## 2020-12-15 LAB — POCT HEMOGLOBIN: Hemoglobin: 11.2 g/dL (ref 11–14.6)

## 2020-12-15 MED ORDER — FAMOTIDINE 20 MG PO TABS: 20.0000 mg | ORAL_TABLET | Freq: Every day | ORAL | 2 refills | Status: DC

## 2020-12-15 NOTE — Patient Instructions (Addendum)
It was wonderful to see you today.  Please bring ALL of your medications with you to every visit.   Today we talked about:  Stop taking iron pills.  Starting famotidine for acid reflux, follow up if this burning sensation does not get better.   Will discuss follow up when I receive pap results.  Please call the clinic at [phone] if your symptoms worsen or you have any concerns. It was our pleasure to serve you.  Dr. Janus Molder

## 2020-12-23 ENCOUNTER — Encounter: Payer: Self-pay | Admitting: Family Medicine

## 2021-01-24 NOTE — Progress Notes (Signed)
    SUBJECTIVE:   CHIEF COMPLAINT / HPI:   [name] is a 35 yo F who presents for the following:   Pap Smear Last Pap smear 2019 negative.  Prior paps: stated normal per patient No LMP recorded. Contraception: Depo 12/12/20; Currently breastfeeding (infant 71 months old) Sexually Active: Yes  Desire for STD Screening: Yes  Concerns: None   PERTINENT  PMH / PSH: Hx of uterine fibroids  OBJECTIVE:   BP 100/70   Pulse 70   Ht 5\' 9"  (1.753 m)   Wt 177 lb 12.8 oz (80.6 kg)   SpO2 98%   BMI 26.26 kg/m   Results for orders placed or performed in visit on 01/25/21 (from the past 24 hour(s))  POCT Wet Prep [facility])     Status: Abnormal   Collection Time: 01/25/21 10:41 AM  Result Value Ref Range   Source Wet Prep POC VAG    WBC, Wet Prep HPF POC 0-3    Bacteria Wet Prep HPF POC Moderate (A) Few   Clue Cells Wet Prep HPF POC None None   Clue Cells Wet Prep Whiff POC Negative Whiff    Yeast Wet Prep HPF POC None None   KOH Wet Prep POC None None   Trichomonas Wet Prep HPF POC Absent Absent   General: Appears well, no acute distress. Age appropriate. Pelvic exam: normal external genitalia, vulva, vagina, cervix, uterus and adnexa, no palpable internal organs, PAP: Pap smear done today, DNA probe for chlamydia and GC obtained, HPV test, WET MOUNT done - results: as above, exam chaperoned by Leonia Corona, [place].  ASSESSMENT/PLAN:   1. Encounter for Papanicolaou smear of cervix - Cytology - PAP([place])  2. Encounter for sexually transmitted disease counseling - POCT Wet Prep [facility]) - Cervicovaginal ancillary only - RPR  3. Encounter for screening for other viral diseases - HIV Antibody (routine testing w rflx) - HCV Ab w Reflex to [place], [place] interpreter used during encounter.

## 2021-01-24 NOTE — Patient Instructions (Addendum)
It was wonderful to see you today.  Follow up as needed. I will notify you of results when available.   Please call the clinic at [phone] if you have any concerns. It was our pleasure to serve you.  Dr. Janus Molder

## 2021-01-25 ENCOUNTER — Encounter: Payer: Self-pay | Admitting: Family Medicine

## 2021-01-25 ENCOUNTER — Other Ambulatory Visit (HOSPITAL_COMMUNITY)
Admission: RE | Admit: 2021-01-25 | Discharge: 2021-01-25 | Disposition: A | Discharge status: Home / Self Care | Payer: Medicaid Other | Source: Ambulatory Visit | Attending: Family Medicine | Admitting: Family Medicine

## 2021-01-25 ENCOUNTER — Other Ambulatory Visit: Payer: Self-pay

## 2021-01-25 ENCOUNTER — Ambulatory Visit (INDEPENDENT_AMBULATORY_CARE_PROVIDER_SITE_OTHER): Payer: Medicaid Other | Admitting: Family Medicine

## 2021-01-25 VITALS — BP 100/70 | HR 70 | Ht 69.0 in | Wt 177.8 lb

## 2021-01-25 DIAGNOSIS — Z124 Encounter for screening for malignant neoplasm of cervix: Secondary | ICD-10-CM

## 2021-01-25 DIAGNOSIS — Z1159 Encounter for screening for other viral diseases: Secondary | ICD-10-CM

## 2021-01-25 DIAGNOSIS — Z708 Other sex counseling: Secondary | ICD-10-CM

## 2021-01-25 LAB — POCT WET PREP (WET MOUNT)
Clue Cells Wet Prep Whiff POC: NEGATIVE
Trichomonas Wet Prep HPF POC: ABSENT

## 2021-01-26 LAB — CERVICOVAGINAL ANCILLARY ONLY
Chlamydia: NEGATIVE
Comment: NEGATIVE
Comment: NORMAL
Neisseria Gonorrhea: NEGATIVE

## 2021-01-27 LAB — HIV ANTIBODY (ROUTINE TESTING W REFLEX): HIV Screen 4th Generation wRfx: NONREACTIVE

## 2021-01-27 LAB — RPR: RPR Ser Ql: NONREACTIVE

## 2021-01-27 LAB — HCV INTERPRETATION

## 2021-01-27 LAB — HCV AB W REFLEX TO QUANT PCR: HCV Ab: <0.1 s/co ratio (ref 0.0–0.9)

## 2021-01-29 LAB — CYTOLOGY - PAP
Comment: NEGATIVE
Diagnosis: NEGATIVE
High risk HPV: POSITIVE — AB

## 2021-02-02 ENCOUNTER — Encounter: Payer: Self-pay | Admitting: Family Medicine

## 2021-02-02 ENCOUNTER — Telehealth: Payer: Self-pay | Admitting: Family Medicine

## 2021-02-02 IMAGING — US US MFM OB FOLLOW-UP
1 series · 13 of 28 positions shown · non-contrast
Comparison: none

[Series 1: us mfm ob follow-up · 41 acquisitions, 13 frames shown]
[im 2/41]
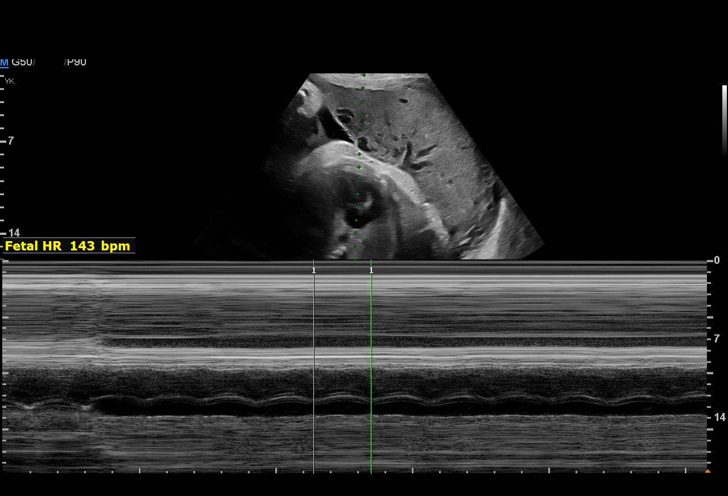
[im 5/41]
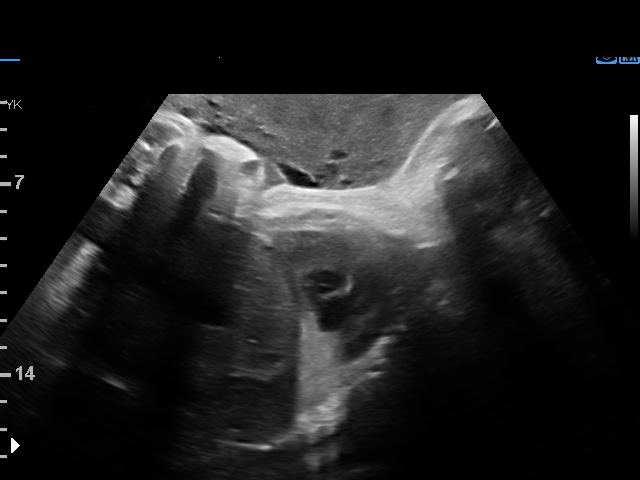
[im 8/41]
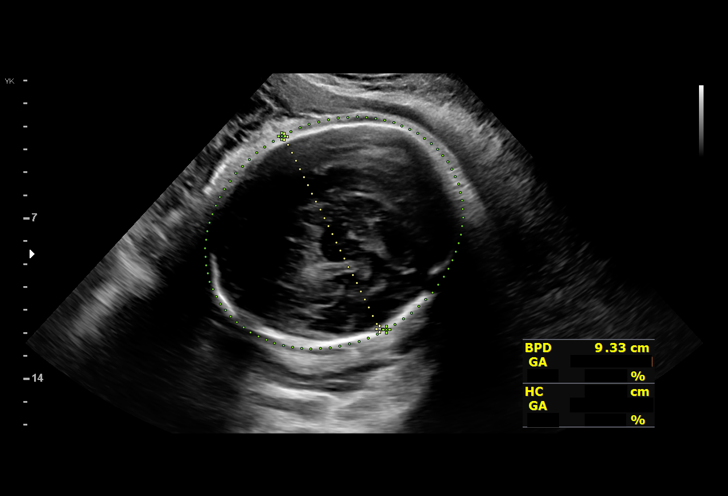
[im 11/41]
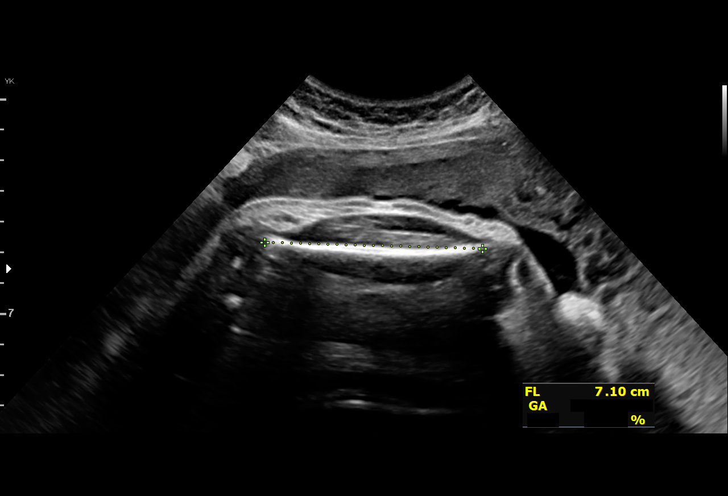
[im 14/41]
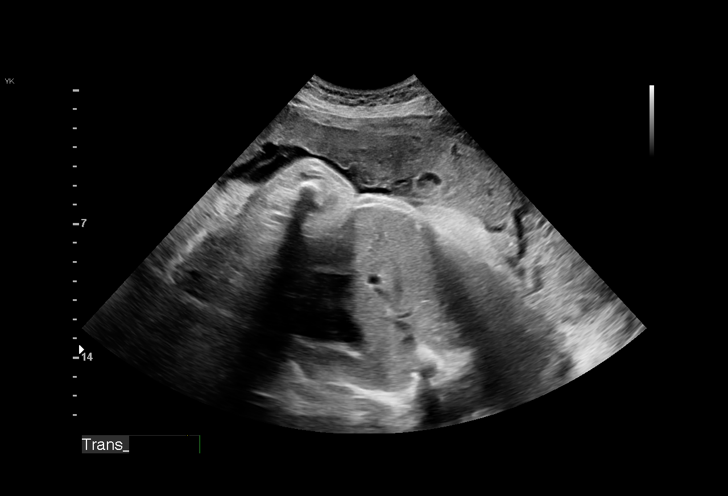
[im 17/41]
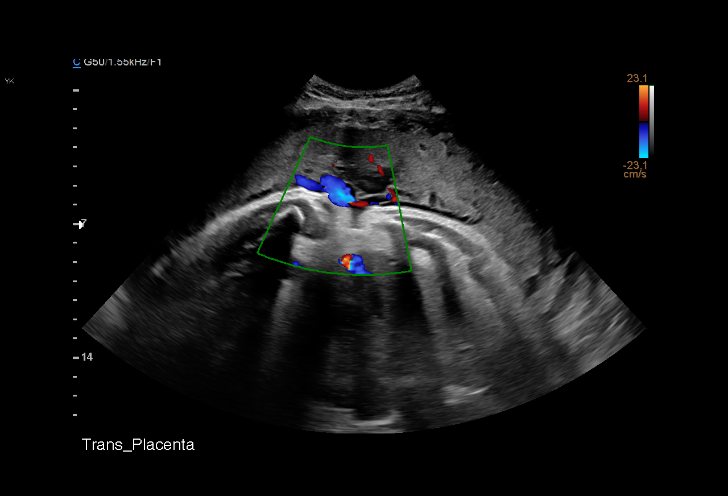
[im 21/41]
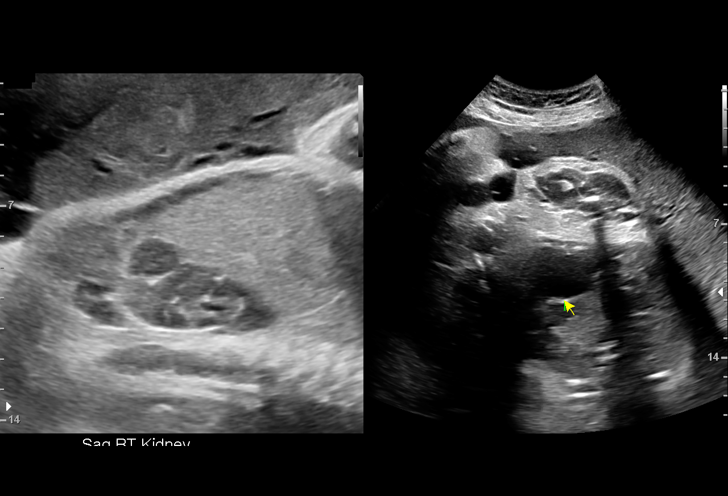
[im 24/41]
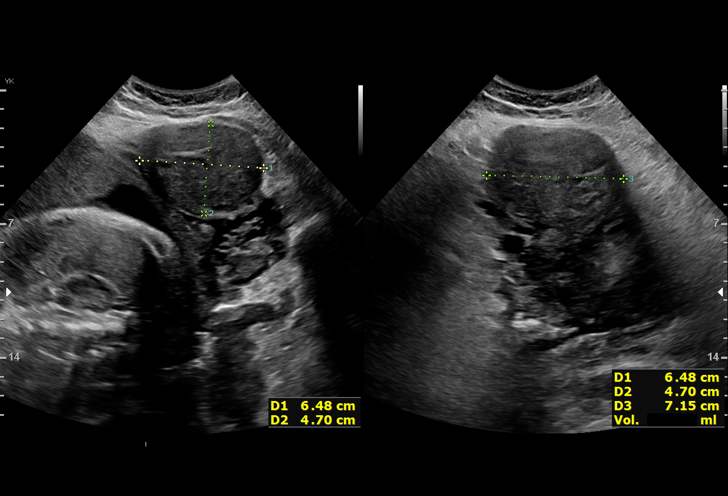
[im 27/41]
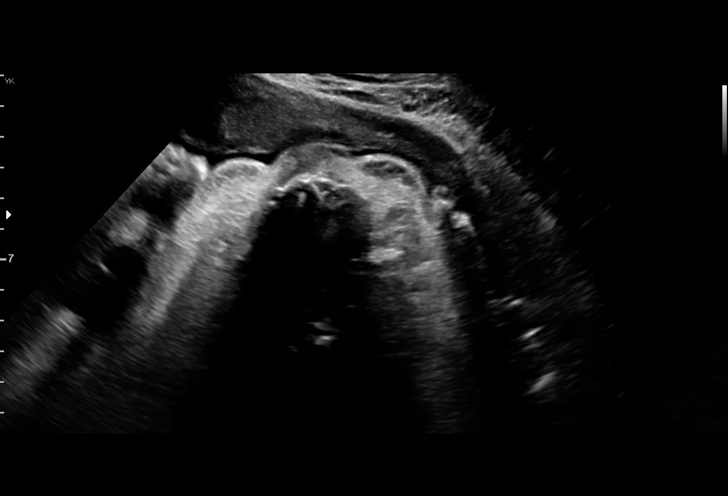
[im 30/41]
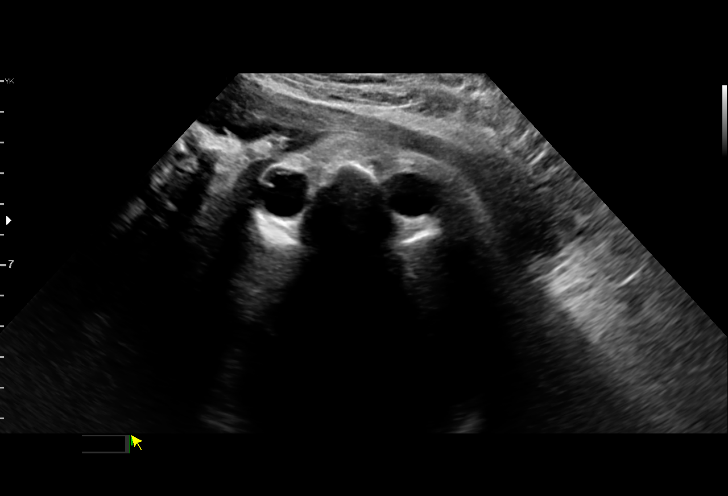
[im 33/41]
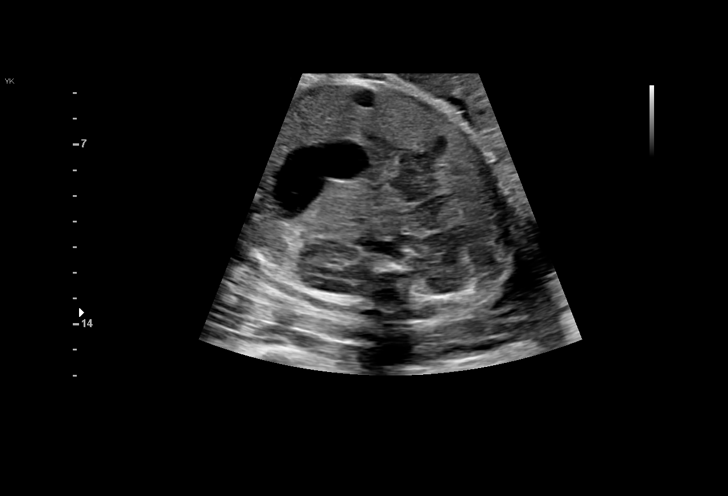
[im 36/41]
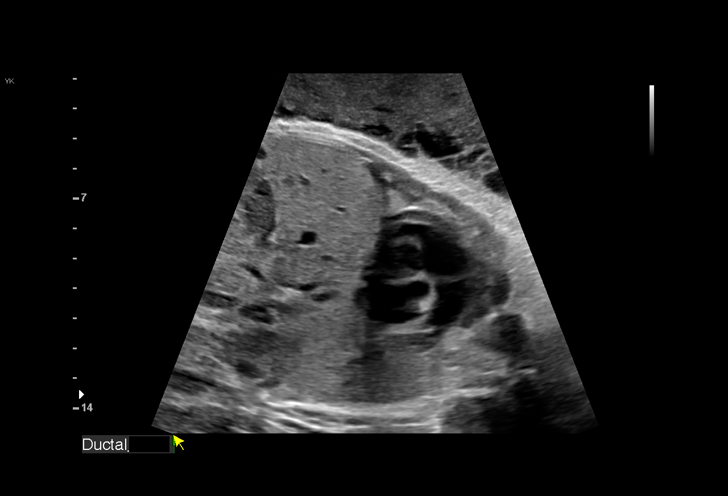
[im 39/41]
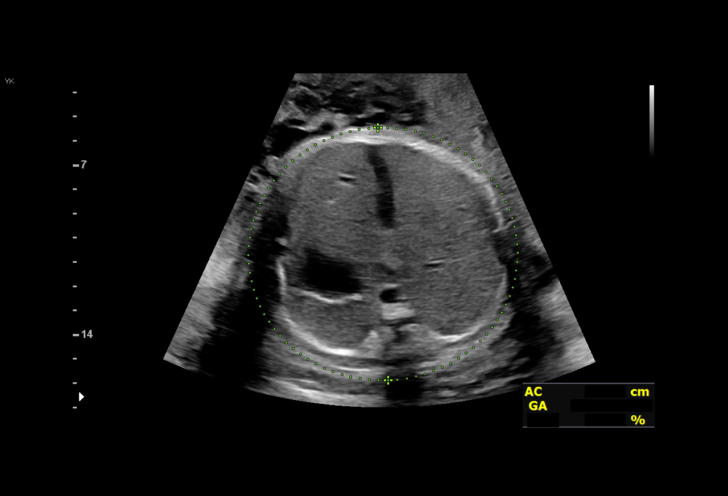

[13 of 28 positions shown; findings below may reference images not displayed]

HOSSEINI NP                              [HOSPITAL] at
 Ref. Address:     Faculty [facility]

Indications

 36 weeks gestation of pregnancy
 Gestational diabetes in pregnancy, diet
 control
 Uterine fibroids affecting pregnancy in third  O34.13,
 trimester, antepartum
 Advanced maternal age multigravida 35+,
 third trimester
 Encounter for other antenatal screening
 follow-up
Fetal Evaluation

 Num Of Fetuses:         1
 Fetal Heart Rate(bpm):  143
 Cardiac Activity:       Observed
 Presentation:           Cephalic
 Placenta:               Anterior
 P. Cord Insertion:      Visualized, central

 Amniotic Fluid
 AFI FV:      Subjectively low-normal

 AFI Sum(cm)     %Tile       Largest Pocket(cm)


 RUQ(cm)       RLQ(cm)       LUQ(cm)        LLQ(cm)

Biometry

 BPD:      92.8  mm     G. Age:  37w 5d         84  %    CI:        77.29   %    70 - 86
                                                         FL/HC:      21.2   %    20.8 -
 HC:      334.2  mm     G. Age:  38w 1d         56  %    HC/AC:      0.96        0.92 -
 AC:      347.2  mm     G. Age:  38w 4d         95  %    FL/BPD:     76.3   %    71 - 87
 FL:       70.8  mm     G. Age:  36w 2d         33  %    FL/AC:      20.4   %    20 - 24

 Est. FW:    9997  gm      7 lb 6 oz     82  %
OB History

 Gravidity:    1
Gestational Age

 LMP:           36w 6d        Date:  11/09/19                 EDD:   08/15/20
 U/S Today:     37w 5d                                        EDD:   08/09/20
 Best:          36w 6d     Det. By:  LMP  (11/09/19)          EDD:   08/15/20
Anatomy

 Cranium:               Appears normal         LVOT:                   Previously seen
 Cavum:                 Previously seen        Aortic Arch:            Previously seen
 Ventricles:            Previously seen        Ductal Arch:            Appears normal
 Choroid Plexus:        Previously seen        Diaphragm:              Previously seen
 Cerebellum:            Previously seen        Stomach:                Appears normal, left
                                                                       sided
 Posterior Fossa:       Previously seen        Abdomen:                Appears normal
 Nuchal Fold:           Not applicable (>20    Abdominal Wall:         Previously seen
                        wks GA)
 Face:                  Orbits and profile     Cord Vessels:           Previously seen
                        previously seen
 Lips:                  Previously seen        Kidneys:                Appear normal
 Palate:                Appears normal         Bladder:                Appears normal
 Thoracic:              Previously seen        Spine:                  Previously seen
 Heart:                 Appears normal         Upper Extremities:      Previously seen
                        (4CH, axis, and
                        situs)
 RVOT:                  Appears normal         Lower Extremities:      Previously seen

 Other:  Female gender previously seen. Technically difficult due to maternal
         habitus and fetal position.
Myomas

 Site                     L(cm)      W(cm)      D(cm)       Location
 Lt superior
 Lt superior

 Blood Flow                  RI       PI       Comments

Impression

 Gestational diabetes. Well-controlled on diet .  I reviewed her
 blood glucose log and they are within normal range.  Blood
 pressure today at her office is 104/50 mmHg.

 Fetal growth is appropriate for gestational age .Amniotic fluid
 is normal and good fetal activity is seen .  Two myomas are
 seen (measurements above).

 I reassured the patient of the findings with help of a language
 interpreter present in the room.
Recommendations

 Follow-up scans as clinically indicated.
                 [place], [place]

## 2021-02-02 NOTE — Telephone Encounter (Signed)
Attempted to call with interpreter about HPV results.  Unable to leave a voicemail as mailbox is not set up.  She is positive for HPV and will need to repeat Pap in 1 year.  Will send letter to patient.  Khalea Ventura Autry-Lott, DO 02/02/2021, 4:53 PM PGY-3, [place]

## 2021-02-27 ENCOUNTER — Other Ambulatory Visit: Payer: Self-pay

## 2021-02-27 ENCOUNTER — Ambulatory Visit (INDEPENDENT_AMBULATORY_CARE_PROVIDER_SITE_OTHER): Payer: Medicaid Other | Admitting: *Deleted

## 2021-02-27 VITALS — BP 120/80 | HR 60 | Ht 69.75 in | Wt 181.9 lb

## 2021-02-27 DIAGNOSIS — Z3042 Encounter for surveillance of injectable contraceptive: Secondary | ICD-10-CM

## 2021-02-27 MED ORDER — MEDROXYPROGESTERONE ACETATE 150 MG/ML IM SUSP
150.0000 mg | Freq: Once | INTRAMUSCULAR | Status: AC
  Administered 2021-02-27: 150 mg via INTRAMUSCULAR

## 2021-02-27 NOTE — Progress Notes (Signed)
Chart reviewed for nurse visit. Agree with plan of care.   Clarnce Flock, MD 02/27/21 9:51 PM

## 2021-02-27 NOTE — Progress Notes (Signed)
Melinda Crutch here for Depo-Provera  Injection.  Injection administered without complication. Patient will return in 3 months for next injection. Last annual exam 09/05/20 ( postpartum) Last pap 01/25/21 last depo-provera 12/12/20. Next depo-provera due 05/15/21- 11/ 8/22.   Lyndon Chenoweth,RN 02/27/2021  9:43 AM

## 2021-03-05 NOTE — Progress Notes (Signed)
 *  In person [place] interpreter used during entire encounter  SUBJECTIVE:   CHIEF COMPLAINT / HPI:   Ms. Birschbach is a 35 yo F who presents for the below.   Pap results She is confused about what her pap results mean and next steps. Someone told her to make an appointment to have them discussed. No physical concerns today.   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   Diagnosis - Negative for intraepithelial lesion or malignancy (NILM)   Comment Normal Reference Range HPV - Negative    General: Appears well, no acute distress. Age appropriate. Respiratory: normal effort Neuro: alert and oriented Psych: normal affect  ASSESSMENT/PLAN:    Encounter to discuss test results Today we discussed the above results. Initially discussed with patient that HPV has a probability of resolving and that we will repeat pap in 1 year but there are some times that can cause malignancy. I further explained possible next step of coloposcopy and what would happen during procedure. This conclusion was gather by using ASCCP tool. Discussed further with Dr. Gwendlyn Deutscher, this provider was corrected and ASCCP tool indicated need for colposcopy with unknown genotype of HrHPV. Appreciate follow up phone note with patient. Scheduled for colpo clinic 8/25.   Gerlene Fee, [place]

## 2021-03-07 ENCOUNTER — Ambulatory Visit (INDEPENDENT_AMBULATORY_CARE_PROVIDER_SITE_OTHER): Payer: Medicaid Other | Admitting: Family Medicine

## 2021-03-07 ENCOUNTER — Other Ambulatory Visit: Payer: Self-pay

## 2021-03-07 VITALS — BP 100/65 | HR 68 | Ht 72.0 in | Wt 184.0 lb

## 2021-03-07 DIAGNOSIS — Z712 Person consulting for explanation of examination or test findings: Secondary | ICD-10-CM | POA: Diagnosis present

## 2021-03-07 NOTE — Patient Instructions (Signed)
Thank you for coming in today.  We discussed your Pap results which indicated high risk HPV with normal cytology.  We also discussed 1 year follow-up with repeat Pap.  I explained neck steps could be the need for colposcopy and what this meant.  You can continue to take medication for your acid reflux.  We also discussed improving diet with decreasing spicy or acidic foods and sitting up right for 30 minutes following a meal.  Follow-up as needed.  Dr. Janus Molder

## 2021-03-08 ENCOUNTER — Telehealth: Payer: Self-pay | Admitting: Family Medicine

## 2021-03-08 NOTE — Telephone Encounter (Signed)
Called patient with [facility] interpreter. Discussed because patient's HrHPV pap results were not genotyped we will need to do a colposcopy. I discussed procedure and encouraged her to ask questions the day of procedure prior to. She voiced understanding. Scheduled 8/25 during this encounter.   [place], DO 03/08/2021, 9:28 AM PGY-3, [place]

## 2021-03-15 ENCOUNTER — Ambulatory Visit: Payer: Medicaid Other

## 2021-03-22 ENCOUNTER — Ambulatory Visit (INDEPENDENT_AMBULATORY_CARE_PROVIDER_SITE_OTHER): Payer: Medicaid Other | Admitting: Family Medicine

## 2021-03-22 ENCOUNTER — Other Ambulatory Visit: Payer: Self-pay

## 2021-03-22 VITALS — BP 123/56 | HR 62 | Wt 184.0 lb

## 2021-03-22 DIAGNOSIS — R87610 Atypical squamous cells of undetermined significance on cytologic smear of cervix (ASC-US): Secondary | ICD-10-CM | POA: Diagnosis not present

## 2021-03-22 DIAGNOSIS — R87810 Cervical high risk human papillomavirus (HPV) DNA test positive: Secondary | ICD-10-CM

## 2021-03-22 DIAGNOSIS — Z0000 Encounter for general adult medical examination without abnormal findings: Secondary | ICD-10-CM | POA: Diagnosis present

## 2021-03-22 DIAGNOSIS — K219 Gastro-esophageal reflux disease without esophagitis: Secondary | ICD-10-CM | POA: Diagnosis not present

## 2021-03-22 LAB — POCT URINE PREGNANCY: Preg Test, Ur: NEGATIVE

## 2021-03-22 MED ORDER — FAMOTIDINE 20 MG PO TABS: 20.0000 mg | ORAL_TABLET | Freq: Every day | ORAL | 2 refills | Status: DC

## 2021-03-22 NOTE — Patient Instructions (Signed)
Your Pap smear as you know had a very mild abnormality.  The colposcopy today was the follow-up of that result and it was clinically totally normal.  I would recommend a Pap smear in 1 year.  That 1 may also be mildly abnormal and if so we will do another colposcopy next year.  This is common.  I would not worry about this.  Just keep your appointment for Pap smear in a year.  We did not do any biopsies so there are no restrictions.

## 2021-03-22 NOTE — Progress Notes (Signed)
Patient given informed consent, signed copy in the chart.  Placed in lithotomy position. Cervix viewed with speculum and colposcope after application of acetic acid.   Colposcopy adequate (entire squamocolumnar junctions seen  in entirety) ?  Yes Acetowhite lesions?  No Punctation?  None Mosaicism?  No Abnormal vasculature?  No Biopsies?  No ECC?  No Complications?  No  COMMENTS: Patient was given post procedure instructions.    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  Would recommend repeat Pap smear with cotesting in 1 year.  Note: Entire visit was performed with in person interpreter [place]

## 2021-05-15 ENCOUNTER — Ambulatory Visit: Payer: Medicaid Other

## 2021-05-22 ENCOUNTER — Ambulatory Visit: Payer: Medicaid Other

## 2021-05-23 ENCOUNTER — Ambulatory Visit (INDEPENDENT_AMBULATORY_CARE_PROVIDER_SITE_OTHER): Payer: Medicaid Other

## 2021-05-23 ENCOUNTER — Other Ambulatory Visit: Payer: Self-pay

## 2021-05-23 VITALS — BP 123/53 | HR 78 | Wt 188.5 lb

## 2021-05-23 DIAGNOSIS — Z3042 Encounter for surveillance of injectable contraceptive: Secondary | ICD-10-CM | POA: Diagnosis not present

## 2021-05-23 MED ORDER — MEDROXYPROGESTERONE ACETATE 150 MG/ML IM SUSP
150.0000 mg | Freq: Once | INTRAMUSCULAR | Status: AC
  Administered 2021-05-23: 150 mg via INTRAMUSCULAR

## 2021-05-23 NOTE — Progress Notes (Signed)
Nicole Lindsey here for Depo-Provera Injection. Injection administered without complication. No other concerns today. Patient will return in 3 months for next injection between 08/08/21 and 08/22/21. Next annual visit due February 2023.   Annabell Howells, RN 05/23/2021  8:36 AM

## 2021-07-28 NOTE — Progress Notes (Signed)
Chart reviewed for nurse visit. Agree with plan of care.   [place], CNM 07/28/2021 3:24 AM

## 2021-08-08 ENCOUNTER — Ambulatory Visit: Payer: Medicaid Other

## 2021-10-15 ENCOUNTER — Ambulatory Visit (INDEPENDENT_AMBULATORY_CARE_PROVIDER_SITE_OTHER): Payer: Medicaid Other | Admitting: Family Medicine

## 2021-10-15 ENCOUNTER — Encounter: Payer: Self-pay | Admitting: Family Medicine

## 2021-10-15 ENCOUNTER — Other Ambulatory Visit: Payer: Self-pay

## 2021-10-15 VITALS — BP 121/68 | HR 72 | Wt 193.6 lb

## 2021-10-15 DIAGNOSIS — M25562 Pain in left knee: Secondary | ICD-10-CM | POA: Diagnosis present

## 2021-10-15 DIAGNOSIS — K219 Gastro-esophageal reflux disease without esophagitis: Secondary | ICD-10-CM

## 2021-10-15 MED ORDER — DICLOFENAC SODIUM 1 % EX GEL: 2.0000 g | Freq: Two times a day (BID) | CUTANEOUS | 0 refills | Status: DC | PRN

## 2021-10-15 MED ORDER — FAMOTIDINE 20 MG PO TABS: 20.0000 mg | ORAL_TABLET | Freq: Every day | ORAL | 2 refills | Status: DC

## 2021-10-15 NOTE — Progress Notes (Signed)
? ? ?  SUBJECTIVE:  ? ?CHIEF COMPLAINT / HPI:  ? ?Left knee pain ?Began 2 weeks ago.  Denies any injury or falls.  She has attempted to use hot water and massage.  Over the last 2 weeks it is seem to get better.  Kneeling makes it worse.  Denies having pain with walking up stairs or getting out of the car.  Does have some pain with changes in weather.  Of note she does have a 34-monthold toddler in which she does endorse crawling around on the floor and kneeling a lot because of this. ? ?PERTINENT  PMH / PSH: As above. ? ?OBJECTIVE:  ? ?BP 121/68   Pulse 72   Wt 193 lb 9.6 oz (87.8 kg)   SpO2 98%   BMI 26.26 kg/m?   ?General: Appears well, no acute distress. Age appropriate. ?Respiratory: normal effort ?Extremities:  ?Left knee: ?- Inspection: no gross deformity b/l. No swelling/effusion, erythema or bruising b/l. Skin intact ?- Palpation: TTP at lateral joint line of left knee ?- ROM: full active ROM with flexion and extension in knee and hip b/l ?- Strength: 5/5 strength b/l ?- Neuro/vasc: NV intact distally b/l ?- Special Tests: ?- LIGAMENTS: negative anterior and posterior drawer, negative Lachman's, no MCL or LCL laxity  ?- MENISCUS: negative McMurray's ?- PF JOINT: nml patellar mobility.  negative patellar grind, negative patellar apprehension ?Neuro: alert and oriented ?Psych: normal affect ? ?ASSESSMENT/PLAN:  ? ?Acute pain of left knee ?2 weeks of left knee pain now improving.  No known injury or trauma.  History of pain with kneeling.  Has pain at lateral joint line otherwise unremarkable knee exam. Consider nursemaid's knee and or arthritis. For now, I think conservative measures would suffice at this time. Discussed padding when kneeling and voltaren gel. She is breastfeeding feed so topical NSAID is best. Can follow up and consider PT, [place] no improvement.  ?- diclofenac Sodium (VOLTAREN) 1 % GEL; Apply 2 g topically 2 (two) times daily as needed.  Dispense: 50 g; Refill: 0 ?  ?Nicole Verne Autry-Lott,  DO ?[facility] ?

## 2021-10-15 NOTE — Patient Instructions (Addendum)
It was wonderful to see you today. ? ?Please bring ALL of your medications with you to every visit.  ? ?Today we talked about: ? ?Left knee pain.  I am glad that it is improving.  We discussed kneeling down to play with your child on something soft or a knee pad.  I have also given you Voltaren gel to rub on your knee as needed.  If this does not get better in the next 2 weeks please follow-up. ? ?Please be sure to schedule follow up at the front  desk before you leave today.  ? ?If you haven't already, sign up for My Chart to have easy access to your labs results, and communication with your primary care physician. ? ?Please call the clinic at [phone] if your symptoms worsen or you have any concerns. It was our pleasure to serve you. ? ?Dr. Janus Molder ? ?

## 2021-11-01 ENCOUNTER — Ambulatory Visit: Payer: Medicaid Other | Admitting: Family Medicine

## 2022-01-17 ENCOUNTER — Ambulatory Visit (INDEPENDENT_AMBULATORY_CARE_PROVIDER_SITE_OTHER): Payer: Medicaid Other | Admitting: Family Medicine

## 2022-01-17 ENCOUNTER — Ambulatory Visit: Payer: Medicaid Other

## 2022-01-17 VITALS — BP 111/62 | HR 78 | Temp 98.2°F | Ht 72.0 in | Wt 198.1 lb

## 2022-01-17 DIAGNOSIS — K219 Gastro-esophageal reflux disease without esophagitis: Secondary | ICD-10-CM

## 2022-01-17 MED ORDER — FAMOTIDINE 20 MG PO TABS: 20.0000 mg | ORAL_TABLET | Freq: Every day | ORAL | 2 refills | Status: DC

## 2022-01-17 NOTE — Assessment & Plan Note (Signed)
-  symptoms seem most consistent with GERD -refills on famotidine prescribed, instructed to take this daily -instructed to maintain food diary to identify triggers and bring to next visit -GERD precautions discussed  -follow up with PCP in 2 weeks if no improvement in symptoms, may consider celiac and H. Pylori testing if appropriate. May benefit from GI referral for EGD if symptoms persist without improvement

## 2022-01-17 NOTE — Patient Instructions (Addendum)
It was great seeing you today!  Today we discussed your stomach pain, I believe this is due to reflux or gastroesophageal reflux disease. Please take famotidine 20 mg once daily. Keep a food diary and also note the times you experience these symptoms so we can determine what food triggers you have. Please bring this to your next visit. Also try to avoid sodas, tomato sauces, alcohol and spicy foods as these can make reflux worse. Make sure to keep at least a 3 hour gap in between your last meal of the day and bedtime.   Please return in 2 weeks if your symptoms do not improve.   Please follow up at your next scheduled appointment, if anything arises between now and then, please don't hesitate to contact our office.   Thank you for allowing [place] to be a part of your medical care!  Thank you, Dr. Larae Grooms

## 2022-01-17 NOTE — Progress Notes (Signed)
    SUBJECTIVE:   CHIEF COMPLAINT / HPI:   Patient presents with abdominal pain that has been ongoing but intermittent for 2 years. Reports epigastric pain, denies reflux sensation. Denies dysphagia or choking sensation. The pain occurs after she finishes eating. Used to take pepcid, last took it a month ago. Stopped taking the pepcid because she ran out. She says that the pepcid helped to relieve her pain. Most of the time her diet consists of rice, gets the pain when she eats spaghetti. Denies eating spicy foods. Denies globulus sensation. Reports soft, formed BM once daily. Denies hematochezia or melena.   OBJECTIVE:   BP 111/62   Pulse 78   Temp 98.2 F (36.8 C) (Oral)   Ht 6' (1.829 m)   Wt 198 lb 2 oz (89.9 kg)   SpO2 100%   BMI 26.87 kg/m   General: Patient well-appearing, in no acute distress. CV: RRR, no murmurs or gallops auscultated Resp: CTAB, no wheezing, rales or rhonchi noted Abdomen: soft, nontender, nondistended, presence of bowel sounds Psych: mood appropriate   ASSESSMENT/PLAN:   GERD (gastroesophageal reflux disease) -symptoms seem most consistent with GERD -refills on famotidine prescribed, instructed to take this daily -instructed to maintain food diary to identify triggers and bring to next visit -GERD precautions discussed  -follow up with PCP in 2 weeks if no improvement in symptoms, may consider celiac and H. Pylori testing if appropriate. May benefit from GI referral for EGD if symptoms persist without improvement     PHQ-9 score of 0 reviewed.    Video Kinyarwandan interpretation ([employer] [phone]) utilized throughout the entirety of this encounter.    Donney Dice, [place]

## 2022-03-21 ENCOUNTER — Ambulatory Visit: Payer: Medicaid Other | Admitting: Family Medicine

## 2022-03-21 NOTE — Progress Notes (Deleted)
    SUBJECTIVE:   CHIEF COMPLAINT / HPI:   Dysuria-   PERTINENT  PMH / PSH: ***  OBJECTIVE:   There were no vitals taken for this visit.  ***  ASSESSMENT/PLAN:   No problem-specific Assessment & Plan notes found for this encounter.     Lenoria Chime, MD [place]

## 2022-03-29 ENCOUNTER — Ambulatory Visit: Payer: Medicaid Other | Admitting: Family Medicine

## 2022-05-07 ENCOUNTER — Ambulatory Visit (INDEPENDENT_AMBULATORY_CARE_PROVIDER_SITE_OTHER): Payer: Medicaid Other | Admitting: *Deleted

## 2022-05-07 DIAGNOSIS — Z3200 Encounter for pregnancy test, result unknown: Secondary | ICD-10-CM

## 2022-05-07 DIAGNOSIS — Z3201 Encounter for pregnancy test, result positive: Secondary | ICD-10-CM

## 2022-05-07 LAB — POCT PREGNANCY, URINE: Preg Test, Ur: POSITIVE — AB

## 2022-05-07 MED ORDER — PRENATAL VITAMIN 27-0.8 MG PO TABS: 1.0000 | ORAL_TABLET | Freq: Every day | ORAL | 11 refills | Status: DC

## 2022-05-07 NOTE — Progress Notes (Signed)
Patient dropped off urine for pregnancy test which was positive. I called patient with [facility]  [phone].  I informed her pregnancy test was positive. She reports LMP 02/21/22. This gives her EDD 11/28/22 and she is [redacted]w[redacted]d I advised her to start prenatal care with provider of her choice. She would like to go here again and would like front office to call her to schedule. I sent message to front office to call her. I reviewed her medications and she is not taking any at present. I recommended she start PNV and she asked me to send in rx which was done.  [name]

## 2022-05-07 NOTE — Patient Instructions (Signed)
Prenatal Care Providers           [facility]  [address] [phone]  [facility]   [address]  [phone]  [facility] [place]       [address] [phone]            [facility]     [address] [phone]          [facility] [place]   [address] [phone]  [facility]  [address] [phone]     [facility]  [address]   [phone]     [facility]  Phone: [phone]  [facility]  Phone: [phone]  [facility] Phone: [phone]  [facility] Phone: [phone]  [facility] Phone: [phone]  [facility] Phone: [phone]  [facility]  Phone: [phone]  

## 2022-05-28 ENCOUNTER — Ambulatory Visit (INDEPENDENT_AMBULATORY_CARE_PROVIDER_SITE_OTHER): Payer: Medicaid Other

## 2022-05-28 ENCOUNTER — Other Ambulatory Visit (HOSPITAL_COMMUNITY)
Admission: RE | Admit: 2022-05-28 | Discharge: 2022-05-28 | Disposition: A | Discharge status: Home / Self Care | Payer: Medicaid Other | Source: Ambulatory Visit | Attending: Family Medicine | Admitting: Family Medicine

## 2022-05-28 VITALS — BP 106/70 | HR 99 | Wt 198.0 lb

## 2022-05-28 DIAGNOSIS — Z8632 Personal history of gestational diabetes: Secondary | ICD-10-CM | POA: Insufficient documentation

## 2022-05-28 DIAGNOSIS — O0990 Supervision of high risk pregnancy, unspecified, unspecified trimester: Secondary | ICD-10-CM | POA: Insufficient documentation

## 2022-05-28 DIAGNOSIS — O09521 Supervision of elderly multigravida, first trimester: Secondary | ICD-10-CM

## 2022-05-28 DIAGNOSIS — O09529 Supervision of elderly multigravida, unspecified trimester: Secondary | ICD-10-CM | POA: Insufficient documentation

## 2022-05-28 HISTORY — DX: Personal history of gestational diabetes: Z86.32

## 2022-05-28 MED ORDER — GOJJI WEIGHT SCALE MISC: 1.0000 | 0 refills | Status: DC | PRN

## 2022-05-28 MED ORDER — BLOOD PRESSURE KIT DEVI: 1.0000 | 0 refills | Status: DC | PRN

## 2022-05-28 NOTE — Progress Notes (Signed)
New OB Intake  Patient came in person for her new OB intake. [facility] [place] interpreter Sherlynn Stalls assist with the visit.   I discussed the limitations, risks, security and privacy concerns of performing an evaluation and management service by telephone and the availability of in person appointments. I also discussed with the patient that there may be a patient responsible charge related to this service. The patient expressed understanding and agreed to proceed.  I explained I am completing New OB Intake today. We discussed EDD of 11/28/2022 that is based on LMP of 02/21/2022. Pt is G2/P1. I reviewed her allergies, medications, Medical/Surgical/OB history, and appropriate screenings. I informed her of [facility] services. [facility] information placed in AVS. Based on history, this is a high risk pregnancy.  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 Patient is aware she will need another pap at her new ob appointment 06/06/2022. -Hx of gestational diabetes, she is aware she will need to her 2 hours GTT lab early.   Delivery Plans Plans to deliver at [facility]. Patient given information for [facility] Healthy Baby website for more information about [facility] and [place]. Patient is not interested in water birth. Offered upcoming OB visit with CNM to discuss further.  MyChart/Babyscripts MyChart access verified. I explained pt will have some visits in office and some virtually. Babyscripts instructions given and order placed. Patient verifies receipt of registration text/e-mail. Account successfully created and app downloaded.  Blood Pressure Cuff/Weight  Scale Blood pressure cuff ordered for patient to pick-up from [employer]. Explained after first prenatal appt pt will check weekly and document in 46. Patient does not have weight scale; order sent to [place], patient may track weight weekly in Babyscripts.  Anatomy US Explained first scheduled [place] will be around 19 weeks. Anatomy US scheduled for 07/12/2022 at 1:00PM PM. Pt notified to arrive at 12:30 PM.  Labs Discussed Johnsie Cancel genetic screening with patient. Would like both Panorama and Horizon drawn at new OB visit. Routine prenatal labs needed.  COVID Vaccine Patient has had COVID vaccine.   Is patient a CenteringPregnancy candidate?  Not a candidate due to Language barrier If accepted,    Is patient a Mom+Baby Combined Care candidate?  Not a candidate   If accepted, Mom+Baby staff notified  Social Determinants of Health Food Insecurity: Patient denies food insecurity. WIC Referral: Patient is interested in referral to [facility].  Transportation: Patient denies transportation needs. Childcare: Discussed no children allowed at ultrasound appointments. Offered childcare services; patient declines childcare services at this time.  First visit review I reviewed new OB appt with patient. I explained they will have a provider visit that includes pap smear and pelvic exam. Explained pt will be seen by Johnston Ebbs NP at first visit; encounter routed to appropriate provider. Explained that patient will be seen by pregnancy navigator following visit with provider.   Mariane Baumgarten, [place] 05/28/2022  11:42 AM

## 2022-05-29 LAB — CBC/D/PLT+RPR+RH+ABO+RUBIGG...
Antibody Screen: NEGATIVE
Basophils Absolute: 0 10*3/uL (ref 0.0–0.2)
Basos: 1 %
EOS (ABSOLUTE): 0.1 10*3/uL (ref 0.0–0.4)
Eos: 1 %
HCV Ab: NONREACTIVE
HIV Screen 4th Generation wRfx: NONREACTIVE
Hematocrit: 38.5 % (ref 34.0–46.6)
Hemoglobin: 12.5 g/dL (ref 11.1–15.9)
Hepatitis B Surface Ag: NEGATIVE
Immature Grans (Abs): 0 10*3/uL (ref 0.0–0.1)
Immature Granulocytes: 0 %
Lymphocytes Absolute: 1.1 10*3/uL (ref 0.7–3.1)
Lymphs: 23 %
MCH: 28.5 pg (ref 26.6–33.0)
MCHC: 32.5 g/dL (ref 31.5–35.7)
MCV: 88 fL (ref 79–97)
Monocytes Absolute: 0.3 10*3/uL (ref 0.1–0.9)
Monocytes: 7 %
Neutrophils Absolute: 3.4 10*3/uL (ref 1.4–7.0)
Neutrophils: 68 %
Platelets: 223 10*3/uL (ref 150–450)
RBC: 4.39 x10E6/uL (ref 3.77–5.28)
RDW: 14.3 % (ref 11.7–15.4)
RPR Ser Ql: NONREACTIVE
Rh Factor: POSITIVE
Rubella Antibodies, IGG: 8.02 index (ref >0.99)
WBC: 4.9 10*3/uL (ref 3.4–10.8)

## 2022-05-29 LAB — HEMOGLOBIN A1C
Est. average glucose Bld gHb Est-mCnc: 111 mg/dL
Hgb A1c MFr Bld: 5.5 % (ref 4.8–5.6)

## 2022-05-29 LAB — GC/CHLAMYDIA PROBE AMP (~~LOC~~) NOT AT ARMC
Chlamydia: NEGATIVE
Comment: NEGATIVE
Comment: NORMAL
Neisseria Gonorrhea: NEGATIVE

## 2022-05-29 LAB — HCV INTERPRETATION

## 2022-05-30 LAB — URINE CULTURE, OB REFLEX

## 2022-05-30 LAB — CULTURE, OB URINE

## 2022-06-03 LAB — PANORAMA PRENATAL TEST FULL PANEL:PANORAMA TEST PLUS 5 ADDITIONAL MICRODELETIONS: FETAL FRACTION: 7.4

## 2022-06-06 ENCOUNTER — Other Ambulatory Visit: Payer: Self-pay

## 2022-06-06 ENCOUNTER — Ambulatory Visit (INDEPENDENT_AMBULATORY_CARE_PROVIDER_SITE_OTHER): Payer: Medicaid Other | Admitting: Student

## 2022-06-06 VITALS — BP 112/50 | HR 78 | Wt 198.1 lb

## 2022-06-06 DIAGNOSIS — O0990 Supervision of high risk pregnancy, unspecified, unspecified trimester: Secondary | ICD-10-CM

## 2022-06-06 DIAGNOSIS — O09522 Supervision of elderly multigravida, second trimester: Secondary | ICD-10-CM | POA: Diagnosis not present

## 2022-06-06 DIAGNOSIS — O0992 Supervision of high risk pregnancy, unspecified, second trimester: Secondary | ICD-10-CM | POA: Diagnosis not present

## 2022-06-06 DIAGNOSIS — Z3A15 15 weeks gestation of pregnancy: Secondary | ICD-10-CM | POA: Diagnosis not present

## 2022-06-06 DIAGNOSIS — Z8632 Personal history of gestational diabetes: Secondary | ICD-10-CM

## 2022-06-06 DIAGNOSIS — Z23 Encounter for immunization: Secondary | ICD-10-CM

## 2022-06-06 DIAGNOSIS — Z789 Other specified health status: Secondary | ICD-10-CM

## 2022-06-06 DIAGNOSIS — O09521 Supervision of elderly multigravida, first trimester: Secondary | ICD-10-CM

## 2022-06-06 DIAGNOSIS — R87810 Cervical high risk human papillomavirus (HPV) DNA test positive: Secondary | ICD-10-CM

## 2022-06-06 MED ORDER — ASPIRIN 81 MG PO TBEC: 81.0000 mg | DELAYED_RELEASE_TABLET | Freq: Every day | ORAL | 2 refills | Status: DC

## 2022-06-06 NOTE — Progress Notes (Signed)
History:   Nicole Lindsey is a 36 y.o. G2P1001 at 65w0dby LMP being seen today for her first obstetrical visit.  Her obstetrical history is significant for advanced maternal age, obesity, and h/o GDM . Patient does intend to breast feed. Pregnancy history fully reviewed.  Patient reports no complaints. Patient does not want pelvic exam today.      HISTORY: OB History  Gravida Para Term Preterm AB Living  _0 0 0 1  SAB IAB Ectopic Multiple Live Births  0 0 0 0 1    # Outcome Date GA Lbr Len/2nd Weight Sex Delivery Anes PTL Lv  2 Current           1 Term 08/04/20 333w3d8:34 / 03:44 7 lb 15 oz (3.6 kg) F Vag-Spont EPI  LIV     Name: Nicole Lindsey     Apgar1: 9  Apgar5: 10    Last pap smear was done 2022 and was abnormal - NILM w/HRHPV   Past Medical History:  Diagnosis Date   Fibroid    Gestational diabetes    Uterine fibroids affecting pregnancy in third trimester 02/27/2020   Past Surgical History:  Procedure Laterality Date   NO PAST SURGERIES     No family history on file. Social History   Tobacco Use   Smoking status: Never   Smokeless tobacco: Never  Substance Use Topics   Alcohol use: Not Currently   Drug use: Not Currently   No Known Allergies Current Outpatient Medications on File Prior to Visit  Medication Sig Dispense Refill   Blood Pressure Monitoring (BLOOD PRESSURE KIT) DEVI 1 Device by Does not apply route as needed. 1 each 0   Misc. Devices (GOJJI WEIGHT SCALE) MISC 1 Device by Does not apply route as needed. 1 each 0   Prenatal Vit-Fe Fumarate-FA (PRENATAL VITAMIN) 27-0.8 MG TABS Take 1 tablet by mouth daily. 30 tablet 11   diclofenac Sodium (VOLTAREN) 1 % GEL Apply 2 g topically 2 (two) times daily as needed. (Patient not taking: Reported on 06/06/2022) 50 g 0   famotidine (PEPCID) 20 MG tablet Take 1 tablet (20 mg total) by mouth daily. (Patient not taking: Reported on 06/06/2022) 30 tablet 2   No current facility-administered medications on  file prior to visit.   Indications for ASA therapy (per uptodate) One of the following: Previous pregnancy with preeclampsia, especially early onset and with an adverse outcome No Multifetal gestation No Chronic hypertension No Type 1 or 2 diabetes mellitus No Chronic kidney disease No Autoimmune disease (antiphospholipid syndrome, systemic lupus erythematosus) No  Two or more of the following: Nulliparity No Obesity (body mass index >30 kg/m2) No Family history of preeclampsia in mother or sister No Age ?355ears Yes Sociodemographic characteristics (African American race, low socioeconomic level) Yes Personal risk factors (eg, previous pregnancy with low birth weight or small for gestational age infant, previous adverse pregnancy outcome [eg, stillbirth], interval >10 years between pregnancies) No  Indications for early GDM screening  First-degree relative with diabetes No BMI >30kg/m2 No Age > 3514es Previous birth of an infant weighing ?4000 g No Gestational diabetes mellitus in a previous pregnancy Yes Glycated hemoglobin ?5.7 percent (39 mmol/mol), impaired glucose tolerance, or impaired fasting glucose on previous testing No High-risk race/ethnicity (eg, African American, Latino, Native American, As[place]merican, Pacific Islander) Yes Previous stillbirth of unknown cause No Maternal birthweight > 9 lbs No History of cardiovascular disease No Hypertension or on therapy for  hypertension No High-density lipoprotein cholesterol level <35 mg/dL (0.90 mmol/L) and/or a triglyceride level >250 mg/dL (2.82 mmol/L) -unknown Polycystic ovary syndrome No Physical inactivity Yes Other clinical condition associated with insulin resistance (eg, severe obesity, acanthosis nigricans) No Current use of glucocorticoids No  Early screening tests: A1C:5.5  Review of Systems Pertinent items noted in HPI and remainder of comprehensive ROS otherwise negative. Physical Exam:   Vitals:    06/06/22 1356  BP: (!) 112/50  Pulse: 78  Weight: 198 lb 1.6 oz (89.9 kg)   Fetal Heart Rate (bpm): 156 Uterus:     Pelvic Exam: Perineum: declined   Vulva: declined   Vagina:  declined   Cervix: declined   Adnexa: no mass, fullness, tenderness   Bony Pelvis: average  System: General: well-developed, well-nourished female in no acute distress   Breasts:  normal appearance, no masses or tenderness bilaterally   Skin: normal coloration and turgor, no rashes   Neurologic: oriented, normal, negative, normal mood   Extremities: normal strength, tone, and muscle mass, ROM of all joints is normal, BLLE edema (non-pitting)   HEENT PERRLA, extraocular movement intact and sclera clear, anicteric   Mouth/Teeth mucous membranes moist, pharynx normal without lesions and dental hygiene good   Neck supple and no masses   Cardiovascular: regular rate and rhythm   Respiratory:  no respiratory distress, normal breath sounds   Abdomen: soft, non-tender; bowel sounds normal; no masses,  no organomegaly   Assessment:    Pregnancy: G2P1001 Patient Active Problem List   Diagnosis Date Noted   Supervision of high risk pregnancy, antepartum 05/28/2022   History of gestational diabetes 05/28/2022   AMA (advanced maternal age) multigravida 35+ 05/28/2022   GERD (gastroesophageal reflux disease) 01/17/2022   Non-English speaking patient 01/03/2019     Plan:    1. Supervision of high risk pregnancy, antepartum  - Doing well, normal exam  2. [redacted] weeks gestation of pregnancy - Anticipatory guidance for routine follow-up in 1 month - aspirin EC 81 MG tablet; Take 1 tablet (81 mg total) by mouth daily. Take after 12 weeks for prevention of preeclampsia later in pregnancy  Dispense: 300 tablet; Refill: 2 - AFP, Serum, Open Spina Bifida  3. History of gestational diabetes - Early Ha1c : 5.5 - Counseled on lifestyle modifications to support blood sugar regulation in pregnancy. Patient verbalizes  understanding  4. Multigravida of advanced maternal age in first trimester - genetic screening negative - Explained rationale for bASA. Patient will decide if she wants to take this, is unsure at this time  5. Need for immunization against influenza - Flu Vaccine QUAD 62moIM (Fluarix, Fluzone & Alfiuria Quad PF)  6. Language barrier affecting health care - Interpreter present at bedside  7. Pap smear of cervix shows high risk HPV present - Declined today - Counseled on importance of completing this postpartum   Initial labs drawn prior to visit today Needs  Pap PP Continue prenatal vitamins. Genetic Screening discussed, First trimester screen, Quad screen, and NIPS: results reviewed and AFP explained Ultrasound discussed; fetal anatomic survey: ordered. Problem list reviewed and updated. The nature of [place] MDs and other Advanced Practice Providers was explained to patient; also emphasized that residents, students are part of our team. Routine obstetric precautions reviewed. Return in about 4 weeks (around 07/04/2022) for LOB, IN-PERSON._0    FJohnston Ebbs NPimafor WDean Foods Company CWainwright

## 2022-06-08 LAB — AFP, SERUM, OPEN SPINA BIFIDA
AFP MoM: 0.94
AFP Value: 25.7 ng/mL
Gest. Age on Collection Date: 15 weeks
Maternal Age At EDD: 37.3 yr
OSBR Risk 1 IN: 10000
Test Results:: NEGATIVE
Weight: 198 [lb_av]

## 2022-07-04 ENCOUNTER — Encounter: Payer: Medicaid Other | Admitting: Obstetrics and Gynecology

## 2022-07-12 ENCOUNTER — Other Ambulatory Visit: Payer: Self-pay | Admitting: *Deleted

## 2022-07-12 ENCOUNTER — Ambulatory Visit: Payer: Medicaid Other | Attending: Student

## 2022-07-12 ENCOUNTER — Ambulatory Visit: Payer: Medicaid Other | Admitting: *Deleted

## 2022-07-12 VITALS — BP 103/45 | HR 72

## 2022-07-12 DIAGNOSIS — O09521 Supervision of elderly multigravida, first trimester: Secondary | ICD-10-CM

## 2022-07-12 DIAGNOSIS — Z8632 Personal history of gestational diabetes: Secondary | ICD-10-CM

## 2022-07-12 DIAGNOSIS — O3412 Maternal care for benign tumor of corpus uteri, second trimester: Secondary | ICD-10-CM | POA: Diagnosis not present

## 2022-07-12 DIAGNOSIS — Z363 Encounter for antenatal screening for malformations: Secondary | ICD-10-CM

## 2022-07-12 DIAGNOSIS — O0990 Supervision of high risk pregnancy, unspecified, unspecified trimester: Secondary | ICD-10-CM | POA: Diagnosis not present

## 2022-07-12 DIAGNOSIS — O09292 Supervision of pregnancy with other poor reproductive or obstetric history, second trimester: Secondary | ICD-10-CM | POA: Diagnosis not present

## 2022-07-12 DIAGNOSIS — D259 Leiomyoma of uterus, unspecified: Secondary | ICD-10-CM | POA: Diagnosis not present

## 2022-07-12 DIAGNOSIS — Z3A20 20 weeks gestation of pregnancy: Secondary | ICD-10-CM

## 2022-07-12 DIAGNOSIS — O09522 Supervision of elderly multigravida, second trimester: Secondary | ICD-10-CM

## 2022-07-14 ENCOUNTER — Encounter: Payer: Self-pay | Admitting: Student

## 2022-07-14 DIAGNOSIS — D259 Leiomyoma of uterus, unspecified: Secondary | ICD-10-CM | POA: Insufficient documentation

## 2022-07-22 NOTE — L&D Delivery Note (Signed)
OB/GYN Faculty Practice Delivery Note  Markan Cazarez is a 37 y.o. G2P1001 at [redacted]w[redacted]d admitted for SROM and moderate vaginal bleeding.   GBS Status: Positive/-- (04/15 1210) Maximum Maternal Temperature: 98.4  Labor course: Initial SVE: 0.5 cm. Augmentation with: N/A. She then progressed to complete.  ROM: rupture date, rupture time, delivery date, or delivery time have not been documented with bloody fluid  Birth: At 1957 a viable female was delivered via spontaneous vaginal delivery (Presentation: LOA). Nuchal cord present: Yes  loose, easily reduced over fetal head.  Shoulders and body delivered in usual fashion. Infant placed directly on mom's abdomen for bonding/skin-to-skin, baby dried and stimulated. Cord clamped x 2 after 1 minute and cut by CNM.  Cord blood collected.  The placenta separated spontaneously and delivered via gentle cord traction.  Pitocin infused rapidly IV per protocol.  Fundus firm with massage.  Placenta inspected and appears to be intact with a 3 VC.  Placenta/Cord with the following complications: abruption.  Cord pH: n/a Sponge and instrument count were correct x2.  Intrapartum complications:  Abruptio Placenta Anesthesia:  epidural Episiotomy: none Lacerations:  none Suture Repair:  n/a EBL (mL): 357   Infant: APGAR (1 MIN): 8   APGAR (5 MINS): 9   Infant weight: pending  Mom to postpartum.  Baby to Couplet care / Skin to Skin. Placenta to L&D   Plans to Breastfeed Contraception: IUD Circumcision: wants inpatient  Note sent to [facility]: [facility] for pp visit.  Raelyn Mora , MSN, CNM 11/11/2022 8:17 PM

## 2022-07-29 NOTE — Progress Notes (Unsigned)
   PRENATAL VISIT NOTE  Subjective:  Nicole Lindsey is a 37 y.o. G2P1001 at 59w4dbeing seen today for ongoing prenatal care.  She is currently monitored for the following issues for this {Blank single:19197::"high-risk","low-risk"} pregnancy and has Non-English speaking patient; GERD (gastroesophageal reflux disease); Supervision of high risk pregnancy, antepartum; History of gestational diabetes; AMA (advanced maternal age) multigravida 327+ and Uterine fibroids affecting pregnancy in second trimester on their problem list.  Patient reports {sx:14538}.   .  .   . Denies leaking of fluid.   The following portions of the patient's history were reviewed and updated as appropriate: allergies, current medications, past family history, past medical history, past social history, past surgical history and problem list.   Objective:  There were no vitals filed for this visit.  Fetal Status:           General:  Alert, oriented and cooperative. Patient is in no acute distress.  Skin: Skin is warm and dry. No rash noted.   Cardiovascular: Normal heart rate noted  Respiratory: Normal respiratory effort, no problems with respiration noted  Abdomen: Soft, gravid, appropriate for gestational age.        Pelvic: {Blank single:19197::"Cervical exam performed in the presence of a chaperone","Cervical exam deferred"}        Extremities: Normal range of motion.     Mental Status: Normal mood and affect. Normal behavior. Normal judgment and thought content.   Assessment and Plan:  Pregnancy: G2P1001 at 271w4d. Supervision of high risk pregnancy, antepartum ***  2. [redacted] weeks gestation of pregnancy ***  3. Multigravida of advanced maternal age in first trimester - genetic screening normal - follow-up scan is scheduled per MFM  4. Language barrier affecting health care ***  {Blank single:19197::"Term","Preterm"} labor symptoms and general obstetric precautions including but not limited to vaginal  bleeding, contractions, leaking of fluid and fetal movement were reviewed in detail with the patient. Please refer to After Visit Summary for other counseling recommendations.   No follow-ups on file.  Future Appointments  Date Time Provider De[place]1/03/2023  9:15 AM FoJohnston EbbsNP [facility]2/08/2022 10:15 AM [facility] NURSE [facility]2/08/2022 10:30 AM [facility] US3 [facility]  NiJohnston EbbsNP

## 2022-07-30 ENCOUNTER — Other Ambulatory Visit: Payer: Self-pay

## 2022-07-30 ENCOUNTER — Encounter: Payer: Self-pay | Admitting: General Practice

## 2022-07-30 ENCOUNTER — Ambulatory Visit (INDEPENDENT_AMBULATORY_CARE_PROVIDER_SITE_OTHER): Payer: Medicaid Other | Admitting: Student

## 2022-07-30 VITALS — BP 107/45 | HR 76 | Wt 205.4 lb

## 2022-07-30 DIAGNOSIS — O0990 Supervision of high risk pregnancy, unspecified, unspecified trimester: Secondary | ICD-10-CM

## 2022-07-30 DIAGNOSIS — Z789 Other specified health status: Secondary | ICD-10-CM

## 2022-07-30 DIAGNOSIS — Z3201 Encounter for pregnancy test, result positive: Secondary | ICD-10-CM

## 2022-07-30 DIAGNOSIS — Z3A22 22 weeks gestation of pregnancy: Secondary | ICD-10-CM

## 2022-07-30 DIAGNOSIS — O09521 Supervision of elderly multigravida, first trimester: Secondary | ICD-10-CM

## 2022-07-30 MED ORDER — PRENATAL VITAMIN 27-0.8 MG PO TABS: 1.0000 | ORAL_TABLET | Freq: Every day | ORAL | 11 refills | Status: DC

## 2022-08-05 ENCOUNTER — Encounter: Payer: Self-pay | Admitting: *Deleted

## 2022-08-23 ENCOUNTER — Ambulatory Visit: Payer: Medicaid Other | Admitting: *Deleted

## 2022-08-23 ENCOUNTER — Other Ambulatory Visit: Payer: Self-pay

## 2022-08-23 ENCOUNTER — Ambulatory Visit: Payer: Medicaid Other | Attending: Maternal & Fetal Medicine

## 2022-08-23 VITALS — BP 100/49 | HR 75

## 2022-08-23 DIAGNOSIS — O09522 Supervision of elderly multigravida, second trimester: Secondary | ICD-10-CM | POA: Insufficient documentation

## 2022-08-23 DIAGNOSIS — O09292 Supervision of pregnancy with other poor reproductive or obstetric history, second trimester: Secondary | ICD-10-CM | POA: Diagnosis not present

## 2022-08-23 DIAGNOSIS — D259 Leiomyoma of uterus, unspecified: Secondary | ICD-10-CM | POA: Diagnosis present

## 2022-08-23 DIAGNOSIS — Z3A26 26 weeks gestation of pregnancy: Secondary | ICD-10-CM

## 2022-08-23 DIAGNOSIS — Z8632 Personal history of gestational diabetes: Secondary | ICD-10-CM

## 2022-08-23 DIAGNOSIS — O3412 Maternal care for benign tumor of corpus uteri, second trimester: Secondary | ICD-10-CM | POA: Diagnosis not present

## 2022-08-23 DIAGNOSIS — O0990 Supervision of high risk pregnancy, unspecified, unspecified trimester: Secondary | ICD-10-CM

## 2022-08-27 ENCOUNTER — Other Ambulatory Visit: Payer: Self-pay

## 2022-08-27 ENCOUNTER — Encounter: Payer: Self-pay | Admitting: Family Medicine

## 2022-08-27 ENCOUNTER — Encounter: Payer: Self-pay | Admitting: Obstetrics and Gynecology

## 2022-08-27 ENCOUNTER — Other Ambulatory Visit: Payer: Medicaid Other

## 2022-08-27 ENCOUNTER — Ambulatory Visit (INDEPENDENT_AMBULATORY_CARE_PROVIDER_SITE_OTHER): Payer: Medicaid Other | Admitting: Obstetrics and Gynecology

## 2022-08-27 VITALS — BP 107/67 | HR 75 | Wt 203.0 lb

## 2022-08-27 VITALS — Wt 210.0 lb

## 2022-08-27 DIAGNOSIS — K219 Gastro-esophageal reflux disease without esophagitis: Secondary | ICD-10-CM

## 2022-08-27 DIAGNOSIS — O09522 Supervision of elderly multigravida, second trimester: Secondary | ICD-10-CM

## 2022-08-27 DIAGNOSIS — D259 Leiomyoma of uterus, unspecified: Secondary | ICD-10-CM

## 2022-08-27 DIAGNOSIS — Z3A26 26 weeks gestation of pregnancy: Secondary | ICD-10-CM

## 2022-08-27 DIAGNOSIS — Z789 Other specified health status: Secondary | ICD-10-CM

## 2022-08-27 DIAGNOSIS — Z8632 Personal history of gestational diabetes: Secondary | ICD-10-CM

## 2022-08-27 DIAGNOSIS — O0990 Supervision of high risk pregnancy, unspecified, unspecified trimester: Secondary | ICD-10-CM

## 2022-08-27 DIAGNOSIS — O0992 Supervision of high risk pregnancy, unspecified, second trimester: Secondary | ICD-10-CM

## 2022-08-27 DIAGNOSIS — O3412 Maternal care for benign tumor of corpus uteri, second trimester: Secondary | ICD-10-CM

## 2022-08-27 MED ORDER — FAMOTIDINE 20 MG PO TABS: 20.0000 mg | ORAL_TABLET | Freq: Two times a day (BID) | ORAL | 3 refills | Status: DC | PRN

## 2022-08-27 NOTE — Progress Notes (Signed)
   PRENATAL VISIT NOTE  Subjective:  Nicole Lindsey is a 37 y.o. G2P1001 at 15w5dbeing seen today for ongoing prenatal care.  She is currently monitored for the following issues for this high-risk pregnancy and has Non-English speaking patient; GERD (gastroesophageal reflux disease); Supervision of high risk pregnancy, antepartum; History of gestational diabetes; AMA (advanced maternal age) multigravida 380+ and Uterine fibroids affecting pregnancy in second trimester on their problem list.  Patient reports no complaints.  Contractions: Not present. Vag. Bleeding: None.  Movement: Present. Denies leaking of fluid.   The following portions of the patient's history were reviewed and updated as appropriate: allergies, current medications, past family history, past medical history, past social history, past surgical history and problem list.   Objective:   Vitals:   08/27/22 1133  BP: 107/67  Pulse: 75  Weight: 203 lb (92.1 kg)    Fetal Status: Fetal Heart Rate (bpm): 159 Fundal Height: 28 cm Movement: Present     General:  Alert, oriented and cooperative. Patient is in no acute distress.  Skin: Skin is warm and dry. No rash noted.   Cardiovascular: Normal heart rate noted  Respiratory: Normal respiratory effort, no problems with respiration noted  Abdomen: Soft, gravid, appropriate for gestational age.  Pain/Pressure: Absent     Pelvic: Cervical exam deferred        Extremities: Normal range of motion.  Edema: None  Mental Status: Normal mood and affect. Normal behavior. Normal judgment and thought content.   Assessment and Plan:  Pregnancy: G2P1001 at 262w5d. Uterine fibroids affecting pregnancy in second trimester Multiple fibroids, largest is 6 cm. Normal grwoth on 2/2 - 86%ile.   2. Non-English speaking patient Interpreter used throughout her appointment  3. Supervision of high risk pregnancy, antepartum Tdap next time   4. History of gestational diabetes Due for 28w labs -  doing them today.   5.50Multigravida of advanced maternal age in second trimester LR NIPS, normal anatomy  6. Pregnancy with 26 completed weeks gestation  Preterm labor symptoms and general obstetric precautions including but not limited to vaginal bleeding, contractions, leaking of fluid and fetal movement were reviewed in detail with the patient. Please refer to After Visit Summary for other counseling recommendations.   Return in about 4 weeks (around 09/24/2022) for OB VISIT, MD or APP.  Future Appointments  Date Time Provider De[place]3/07/2022 10:15 AM [facility] NURSE [facility]/07/2022 10:30 AM [facility] US2 [facility]4/07/2022 10:30 AM [facility] NURSE [facility]4/07/2022 10:45 AM [facility] US5 [facility] W[facility]    PaRadene GunningMD

## 2022-08-27 NOTE — Addendum Note (Signed)
Addended by: Radene Gunning A on: 08/27/2022 12:09 PM   Modules accepted: Orders

## 2022-08-28 ENCOUNTER — Telehealth: Payer: Self-pay | Admitting: *Deleted

## 2022-08-28 ENCOUNTER — Encounter: Payer: Self-pay | Admitting: *Deleted

## 2022-08-28 LAB — CBC
Hematocrit: 36.1 % (ref 34.0–46.6)
Hemoglobin: 12 g/dL (ref 11.1–15.9)
MCH: 30 pg (ref 26.6–33.0)
MCHC: 33.2 g/dL (ref 31.5–35.7)
MCV: 90 fL (ref 79–97)
Platelets: 186 10*3/uL (ref 150–450)
RBC: 4 x10E6/uL (ref 3.77–5.28)
RDW: 13.9 % (ref 11.7–15.4)
WBC: 4.8 10*3/uL (ref 3.4–10.8)

## 2022-08-28 LAB — GLUCOSE TOLERANCE, 2 HOURS W/ 1HR
Glucose, 1 hour: 141 mg/dL (ref 70–179)
Glucose, 2 hour: 131 mg/dL (ref 70–152)
Glucose, Fasting: 80 mg/dL (ref 70–91)

## 2022-08-28 LAB — HIV ANTIBODY (ROUTINE TESTING W REFLEX): HIV Screen 4th Generation wRfx: NONREACTIVE

## 2022-08-28 LAB — RPR: RPR Ser Ql: NONREACTIVE

## 2022-08-28 NOTE — Telephone Encounter (Addendum)
-----   Message from Radene Gunning, MD sent at 08/28/2022 11:49 AM EST ----- Please let the patient know her lab results were all normal including her GTT.  Thanks, pad  2/7  1700 Called pt w. [place] interpreter [phone] and pt did not answer. I was unable to leave a message as she does not have voicemail set up yet.

## 2022-08-29 NOTE — Telephone Encounter (Signed)
08/29/22  Attempted to call patient with [place] [phone] at 1623 and patient did not answer. Was not able to leave message because patient's voicemail box has not been set up yet. Adonis Huguenin RN

## 2022-09-10 ENCOUNTER — Encounter: Payer: Medicaid Other | Admitting: Obstetrics and Gynecology

## 2022-09-20 ENCOUNTER — Ambulatory Visit: Payer: Medicaid Other | Admitting: *Deleted

## 2022-09-20 ENCOUNTER — Ambulatory Visit: Payer: Medicaid Other | Attending: Maternal & Fetal Medicine

## 2022-09-20 VITALS — BP 94/48 | HR 73

## 2022-09-20 DIAGNOSIS — O3412 Maternal care for benign tumor of corpus uteri, second trimester: Secondary | ICD-10-CM | POA: Diagnosis present

## 2022-09-20 DIAGNOSIS — O0990 Supervision of high risk pregnancy, unspecified, unspecified trimester: Secondary | ICD-10-CM

## 2022-09-20 DIAGNOSIS — D259 Leiomyoma of uterus, unspecified: Secondary | ICD-10-CM

## 2022-09-20 DIAGNOSIS — Z3A30 30 weeks gestation of pregnancy: Secondary | ICD-10-CM

## 2022-09-20 DIAGNOSIS — O09522 Supervision of elderly multigravida, second trimester: Secondary | ICD-10-CM | POA: Insufficient documentation

## 2022-09-20 DIAGNOSIS — O09523 Supervision of elderly multigravida, third trimester: Secondary | ICD-10-CM | POA: Insufficient documentation

## 2022-09-20 DIAGNOSIS — O09293 Supervision of pregnancy with other poor reproductive or obstetric history, third trimester: Secondary | ICD-10-CM | POA: Diagnosis not present

## 2022-09-20 DIAGNOSIS — Z8632 Personal history of gestational diabetes: Secondary | ICD-10-CM | POA: Insufficient documentation

## 2022-09-20 DIAGNOSIS — O3413 Maternal care for benign tumor of corpus uteri, third trimester: Secondary | ICD-10-CM | POA: Diagnosis not present

## 2022-09-24 ENCOUNTER — Ambulatory Visit (INDEPENDENT_AMBULATORY_CARE_PROVIDER_SITE_OTHER): Payer: Medicaid Other | Admitting: Obstetrics and Gynecology

## 2022-09-24 ENCOUNTER — Other Ambulatory Visit: Payer: Self-pay

## 2022-09-24 VITALS — BP 114/41 | HR 73 | Wt 208.0 lb

## 2022-09-24 DIAGNOSIS — Z8632 Personal history of gestational diabetes: Secondary | ICD-10-CM

## 2022-09-24 DIAGNOSIS — O0990 Supervision of high risk pregnancy, unspecified, unspecified trimester: Secondary | ICD-10-CM

## 2022-09-24 DIAGNOSIS — Z3A30 30 weeks gestation of pregnancy: Secondary | ICD-10-CM

## 2022-09-24 DIAGNOSIS — Z789 Other specified health status: Secondary | ICD-10-CM

## 2022-09-24 DIAGNOSIS — O3663X0 Maternal care for excessive fetal growth, third trimester, not applicable or unspecified: Secondary | ICD-10-CM

## 2022-09-24 DIAGNOSIS — O3660X0 Maternal care for excessive fetal growth, unspecified trimester, not applicable or unspecified: Secondary | ICD-10-CM | POA: Insufficient documentation

## 2022-09-24 DIAGNOSIS — O0993 Supervision of high risk pregnancy, unspecified, third trimester: Secondary | ICD-10-CM

## 2022-09-24 DIAGNOSIS — O09523 Supervision of elderly multigravida, third trimester: Secondary | ICD-10-CM

## 2022-09-24 DIAGNOSIS — K219 Gastro-esophageal reflux disease without esophagitis: Secondary | ICD-10-CM

## 2022-09-24 MED ORDER — FAMOTIDINE 20 MG PO TABS: 20.0000 mg | ORAL_TABLET | Freq: Two times a day (BID) | ORAL | 3 refills | Status: DC | PRN

## 2022-09-24 NOTE — Progress Notes (Signed)
   PRENATAL VISIT NOTE  Subjective:  Nicole Lindsey is a 37 y.o. G2P1001 at 51w5dbeing seen today for ongoing prenatal care.  She is currently monitored for the following issues for this low-risk pregnancy and has Non-English speaking patient; GERD (gastroesophageal reflux disease); Supervision of high risk pregnancy, antepartum; History of gestational diabetes; AMA (advanced maternal age) multigravida 35+; Uterine fibroids affecting pregnancy in second trimester; and LGA (large for gestational age) fetus affecting management of mother on their problem list.  Patient doing well with no acute concerns today. She reports no complaints.  Contractions: Not present. Vag. Bleeding: None.  Movement: Present. Denies leaking of fluid.   The following portions of the patient's history were reviewed and updated as appropriate: allergies, current medications, past family history, past medical history, past social history, past surgical history and problem list. Problem list updated.  Objective:   Vitals:   09/24/22 1109  BP: (!) 114/41  Pulse: 73  Weight: 208 lb (94.3 kg)    Fetal Status: Fetal Heart Rate (bpm): 149 Fundal Height: 32 cm Movement: Present     General:  Alert, oriented and cooperative. Patient is in no acute distress.  Skin: Skin is warm and dry. No rash noted.   Cardiovascular: Normal heart rate noted  Respiratory: Normal respiratory effort, no problems with respiration noted  Abdomen: Soft, gravid, appropriate for gestational age.  Pain/Pressure: Absent     Pelvic: Cervical exam deferred        Extremities: Normal range of motion.  Edema: None  Mental Status:  Normal mood and affect. Normal behavior. Normal judgment and thought content.   Assessment and Plan:  Pregnancy: G2P1001 at 334w5d1. [redacted] weeks gestation of pregnancy   2. Supervision of high risk pregnancy, antepartum Continue routine prenatal care  3. Non-English speaking patient Tele interpreter used   4. History  of gestational diabetes 2 hour GTT normal  5. Multigravida of advanced maternal age in third trimester   6. Excessive fetal growth affecting management of pregnancy in third trimester, single or unspecified fetus Fetus at 94%, pt has repeat growth on 10/21/22  7. Gastroesophageal reflux disease without esophagitis Pt requested refill of medication  - famotidine (PEPCID) 20 MG tablet; Take 1 tablet (20 mg total) by mouth 2 (two) times daily as needed for heartburn or indigestion.  Dispense: 30 tablet; Refill: 3  Preterm labor symptoms and general obstetric precautions including but not limited to vaginal bleeding, contractions, leaking of fluid and fetal movement were reviewed in detail with the patient.  Please refer to After Visit Summary for other counseling recommendations.   Return in about 2 weeks (around 10/08/2022) for LOB, in person.   LaLynnda ShieldsMD Faculty Attending [facility]

## 2022-10-11 ENCOUNTER — Encounter: Payer: Self-pay | Admitting: Obstetrics and Gynecology

## 2022-10-11 ENCOUNTER — Ambulatory Visit (INDEPENDENT_AMBULATORY_CARE_PROVIDER_SITE_OTHER): Payer: Medicaid Other | Admitting: Obstetrics and Gynecology

## 2022-10-11 VITALS — BP 104/68 | HR 79 | Wt 204.0 lb

## 2022-10-11 DIAGNOSIS — O0990 Supervision of high risk pregnancy, unspecified, unspecified trimester: Secondary | ICD-10-CM

## 2022-10-11 DIAGNOSIS — D259 Leiomyoma of uterus, unspecified: Secondary | ICD-10-CM

## 2022-10-11 DIAGNOSIS — Z3A33 33 weeks gestation of pregnancy: Secondary | ICD-10-CM

## 2022-10-11 DIAGNOSIS — O3413 Maternal care for benign tumor of corpus uteri, third trimester: Secondary | ICD-10-CM

## 2022-10-11 DIAGNOSIS — O3663X0 Maternal care for excessive fetal growth, third trimester, not applicable or unspecified: Secondary | ICD-10-CM

## 2022-10-11 DIAGNOSIS — O09523 Supervision of elderly multigravida, third trimester: Secondary | ICD-10-CM

## 2022-10-11 DIAGNOSIS — O0993 Supervision of high risk pregnancy, unspecified, third trimester: Secondary | ICD-10-CM

## 2022-10-11 NOTE — Progress Notes (Signed)
   PRENATAL VISIT NOTE  Subjective:  Nicole Lindsey is a 37 y.o. G2P1001 at [redacted]w[redacted]d being seen today for ongoing prenatal care.  She is currently monitored for the following issues for this low-risk pregnancy and has Non-English speaking patient; GERD (gastroesophageal reflux disease); Supervision of high risk pregnancy, antepartum; History of gestational diabetes; AMA (advanced maternal age) multigravida 51+; Uterine fibroids affecting pregnancy in second trimester; and LGA (large for gestational age) fetus affecting management of mother on their problem list.  Patient reports no complaints.  Contractions: Not present. Vag. Bleeding: None.  Movement: Present. Denies leaking of fluid.   The following portions of the patient's history were reviewed and updated as appropriate: allergies, current medications, past family history, past medical history, past social history, past surgical history and problem list.   Objective:   Vitals:   10/11/22 1119  BP: 104/68  Pulse: 79  Weight: 204 lb (92.5 kg)    Fetal Status: Fetal Heart Rate (bpm): 154 Fundal Height: 34 cm Movement: Present     General:  Alert, oriented and cooperative. Patient is in no acute distress.  Skin: Skin is warm and dry. No rash noted.   Cardiovascular: Normal heart rate noted  Respiratory: Normal respiratory effort, no problems with respiration noted  Abdomen: Soft, gravid, appropriate for gestational age.  Pain/Pressure: Absent     Pelvic: Cervical exam deferred        Extremities: Normal range of motion.  Edema: None  Mental Status: Normal mood and affect. Normal behavior. Normal judgment and thought content.   Assessment and Plan:  Pregnancy: G2P1001 at [redacted]w[redacted]d 1. Supervision of high risk pregnancy, antepartum Patient is doing well without complaints Work restriction note provided  2. Excessive fetal growth affecting management of pregnancy in third trimester, single or unspecified fetus Normal 2 hour glucola EFW 1903  gm (94%tile) on 3/1 Follow up [place] on 4/1 Patient with previous 8 lb infant  3. Multigravida of advanced maternal age in third trimester Continue ASA  4. Uterine fibroids affecting pregnancy in second trimester   Preterm labor symptoms and general obstetric precautions including but not limited to vaginal bleeding, contractions, leaking of fluid and fetal movement were reviewed in detail with the patient. Please refer to After Visit Summary for other counseling recommendations.   Return in about 2 weeks (around 10/25/2022) for in person, ROB, High risk.  Future Appointments  Date Time Provider [place]  10/21/2022 10:30 AM [facility] NURSE [facility]  10/21/2022 10:45 AM [facility] US5 [facility]  10/25/2022 10:35 AM [place]-Kirby, Kathie Dike, CNM [facility]    Mora Bellman, MD

## 2022-10-21 ENCOUNTER — Ambulatory Visit: Payer: Medicaid Other | Attending: Maternal & Fetal Medicine

## 2022-10-21 ENCOUNTER — Other Ambulatory Visit: Payer: Self-pay | Admitting: *Deleted

## 2022-10-21 ENCOUNTER — Ambulatory Visit: Payer: Medicaid Other | Admitting: *Deleted

## 2022-10-21 VITALS — BP 118/50 | HR 77

## 2022-10-21 DIAGNOSIS — O3663X0 Maternal care for excessive fetal growth, third trimester, not applicable or unspecified: Secondary | ICD-10-CM

## 2022-10-21 DIAGNOSIS — O0990 Supervision of high risk pregnancy, unspecified, unspecified trimester: Secondary | ICD-10-CM | POA: Insufficient documentation

## 2022-10-21 DIAGNOSIS — O09293 Supervision of pregnancy with other poor reproductive or obstetric history, third trimester: Secondary | ICD-10-CM | POA: Diagnosis not present

## 2022-10-21 DIAGNOSIS — O09523 Supervision of elderly multigravida, third trimester: Secondary | ICD-10-CM | POA: Diagnosis not present

## 2022-10-21 DIAGNOSIS — Z8632 Personal history of gestational diabetes: Secondary | ICD-10-CM | POA: Diagnosis present

## 2022-10-21 DIAGNOSIS — D259 Leiomyoma of uterus, unspecified: Secondary | ICD-10-CM | POA: Diagnosis present

## 2022-10-21 DIAGNOSIS — O3413 Maternal care for benign tumor of corpus uteri, third trimester: Secondary | ICD-10-CM

## 2022-10-21 DIAGNOSIS — O09522 Supervision of elderly multigravida, second trimester: Secondary | ICD-10-CM | POA: Diagnosis not present

## 2022-10-21 DIAGNOSIS — Z3689 Encounter for other specified antenatal screening: Secondary | ICD-10-CM

## 2022-10-21 DIAGNOSIS — Z3A34 34 weeks gestation of pregnancy: Secondary | ICD-10-CM | POA: Diagnosis not present

## 2022-10-21 DIAGNOSIS — O3412 Maternal care for benign tumor of corpus uteri, second trimester: Secondary | ICD-10-CM | POA: Diagnosis present

## 2022-10-21 DIAGNOSIS — Z8742 Personal history of other diseases of the female genital tract: Secondary | ICD-10-CM

## 2022-10-25 ENCOUNTER — Ambulatory Visit (INDEPENDENT_AMBULATORY_CARE_PROVIDER_SITE_OTHER): Payer: Medicaid Other | Admitting: Advanced Practice Midwife

## 2022-10-25 ENCOUNTER — Other Ambulatory Visit: Payer: Self-pay

## 2022-10-25 VITALS — BP 94/61 | HR 73 | Wt 207.5 lb

## 2022-10-25 DIAGNOSIS — O3663X0 Maternal care for excessive fetal growth, third trimester, not applicable or unspecified: Secondary | ICD-10-CM

## 2022-10-25 DIAGNOSIS — O0990 Supervision of high risk pregnancy, unspecified, unspecified trimester: Secondary | ICD-10-CM

## 2022-10-25 DIAGNOSIS — D259 Leiomyoma of uterus, unspecified: Secondary | ICD-10-CM

## 2022-10-25 DIAGNOSIS — Z3A35 35 weeks gestation of pregnancy: Secondary | ICD-10-CM

## 2022-10-25 DIAGNOSIS — O0993 Supervision of high risk pregnancy, unspecified, third trimester: Secondary | ICD-10-CM

## 2022-10-25 DIAGNOSIS — O09523 Supervision of elderly multigravida, third trimester: Secondary | ICD-10-CM

## 2022-10-25 DIAGNOSIS — O3413 Maternal care for benign tumor of corpus uteri, third trimester: Secondary | ICD-10-CM

## 2022-10-25 NOTE — Progress Notes (Signed)
   PRENATAL VISIT NOTE  Subjective:  Nicole Lindsey is a 37 y.o. G2P1001 at [redacted]w[redacted]d being seen today for ongoing prenatal care.  She is currently monitored for the following issues for this high-risk pregnancy and has Non-English speaking patient; GERD (gastroesophageal reflux disease); Supervision of high risk pregnancy, antepartum; History of gestational diabetes; AMA (advanced maternal age) multigravida 35+; Uterine fibroids affecting pregnancy in second trimester; and LGA (large for gestational age) fetus affecting management of mother on their problem list.  Patient reports no complaints.  Contractions: Not present. Vag. Bleeding: None.  Movement: Present. Denies leaking of fluid.   The following portions of the patient's history were reviewed and updated as appropriate: allergies, current medications, past family history, past medical history, past social history, past surgical history and problem list.   Objective:   Vitals:   10/25/22 1040  BP: 94/61  Pulse: 73  Weight: 207 lb 8 oz (94.1 kg)    Fetal Status: Fetal Heart Rate (bpm): 145 Fundal Height: 37 cm Movement: Present     General:  Alert, oriented and cooperative. Patient is in no acute distress.  Skin: Skin is warm and dry. No rash noted.   Cardiovascular: Normal heart rate noted  Respiratory: Normal respiratory effort, no problems with respiration noted  Abdomen: Soft, gravid, appropriate for gestational age.  Pain/Pressure: Absent     Pelvic: Cervical exam deferred        Extremities: Normal range of motion.  Edema: None  Mental Status: Normal mood and affect. Normal behavior. Normal judgment and thought content.   Assessment and Plan:  Pregnancy: G2P1001 at [redacted]w[redacted]d 1. Supervision of high risk pregnancy, antepartum --Anticipatory guidance about next visits/weeks of pregnancy given.   2. Excessive fetal growth affecting management of pregnancy in third trimester, single or unspecified fetus --EFW 99%, previous baby 3.6 kg  per pt.  GDM previous pregnancy, passed 2 hour GTT this pregnancy. --Discussed LGA and risk of shoulder dystocia/need to watch labor progress with pt.  Will encourage labor progress with maternal position change and augmentation methods PRN but will likely offer cesarean section earlier with arrest of labor given LGA.   3. Multigravida of advanced maternal age in third trimester   4. Uterine fibroids affecting pregnancy in second trimester --FH 2 cm ahead, but also EFW 99%  5. [redacted] weeks gestation of pregnancy   Preterm labor symptoms and general obstetric precautions including but not limited to vaginal bleeding, contractions, leaking of fluid and fetal movement were reviewed in detail with the patient. Please refer to After Visit Summary for other counseling recommendations.   Return in about 1 week (around 11/01/2022) for Female provider preferred.  Future Appointments  Date Time Provider Department Center  11/04/2022  8:55 AM Ness Bing, MD [facility]  11/11/2022  9:55 AM Corlis Hove, NP [facility]  11/19/2022 12:30 PM [facility] NURSE [facility]  11/19/2022 12:45 PM [facility] US4 [facility]    Sharen Counter, CNM

## 2022-11-04 ENCOUNTER — Other Ambulatory Visit (HOSPITAL_COMMUNITY)
Admission: RE | Admit: 2022-11-04 | Discharge: 2022-11-04 | Disposition: A | Discharge status: Home / Self Care | Payer: Medicaid Other | Source: Ambulatory Visit | Attending: Obstetrics and Gynecology | Admitting: Obstetrics and Gynecology

## 2022-11-04 ENCOUNTER — Ambulatory Visit (INDEPENDENT_AMBULATORY_CARE_PROVIDER_SITE_OTHER): Payer: Medicaid Other | Admitting: Obstetrics and Gynecology

## 2022-11-04 ENCOUNTER — Encounter: Payer: Self-pay | Admitting: Obstetrics and Gynecology

## 2022-11-04 VITALS — BP 97/65 | HR 74 | Wt 207.9 lb

## 2022-11-04 DIAGNOSIS — O26893 Other specified pregnancy related conditions, third trimester: Secondary | ICD-10-CM | POA: Diagnosis not present

## 2022-11-04 DIAGNOSIS — Z3A36 36 weeks gestation of pregnancy: Secondary | ICD-10-CM

## 2022-11-04 DIAGNOSIS — Z603 Acculturation difficulty: Secondary | ICD-10-CM

## 2022-11-04 DIAGNOSIS — Z758 Other problems related to medical facilities and other health care: Secondary | ICD-10-CM

## 2022-11-04 DIAGNOSIS — D259 Leiomyoma of uterus, unspecified: Secondary | ICD-10-CM

## 2022-11-04 DIAGNOSIS — O09523 Supervision of elderly multigravida, third trimester: Secondary | ICD-10-CM

## 2022-11-04 DIAGNOSIS — O3663X0 Maternal care for excessive fetal growth, third trimester, not applicable or unspecified: Secondary | ICD-10-CM

## 2022-11-04 DIAGNOSIS — O3413 Maternal care for benign tumor of corpus uteri, third trimester: Secondary | ICD-10-CM

## 2022-11-04 NOTE — Progress Notes (Signed)
   PRENATAL VISIT NOTE  Subjective:  Nicole Lindsey is a 37 y.o. G2P1001 at [redacted]w[redacted]d being seen today for ongoing prenatal care.  She is currently monitored for the following issues for this high-risk pregnancy and has Non-English speaking patient; GERD (gastroesophageal reflux disease); Supervision of high risk pregnancy, antepartum; AMA (advanced maternal age) multigravida 35+; Uterine fibroids affecting pregnancy in second trimester; and LGA (large for gestational age) fetus affecting management of mother on their problem list.  Patient reports no complaints.  Contractions: Not present. Vag. Bleeding: None.  Movement: Present. Denies leaking of fluid.   The following portions of the patient's history were reviewed and updated as appropriate: allergies, current medications, past family history, past medical history, past social history, past surgical history and problem list.   Objective:   Vitals:   11/04/22 0929  BP: 97/65  Pulse: 74  Weight: 207 lb 14.4 oz (94.3 kg)    Fetal Status: Fetal Heart Rate (bpm): 136   Movement: Present  Presentation: Vertex  General:  Alert, oriented and cooperative. Patient is in no acute distress.  Skin: Skin is warm and dry. No rash noted.   Cardiovascular: Normal heart rate noted  Respiratory: Normal respiratory effort, no problems with respiration noted  Abdomen: Soft, gravid, appropriate for gestational age.  Pain/Pressure: Present     Pelvic: Cervical exam performed in the presence of a chaperone Dilation: Fingertip Effacement (%): 50    Extremities: Normal range of motion.  Edema: None  Mental Status: Normal mood and affect. Normal behavior. Normal judgment and thought content.   Assessment and Plan:  Pregnancy: G2P1001 at [redacted]w[redacted]d 1. [redacted] weeks gestation of pregnancy Needs PP pap - Culture, beta strep (group b only) - GC/Chlamydia probe amp ([place])not at [facility]  2. Language barrier Ipad interpeter used  3. Multigravida of advanced maternal age  in third trimester No issues  4. Excessive fetal growth affecting management of pregnancy in third trimester, single or unspecified fetus 4/1: 99%, 3191gm, ac >99%, afi 20.7, ceph S/p normal GTT, panorama and mfm anatomy u/s. First baby was 3600gm D/w her that plan is to follow up her next scan and make delivery timing recommendations then.   5. Uterine fibroids affecting pregnancy in second trimester Last mention on 2/2 u/s and they were 3-6cm right, left, left anterior; no mention on 3/1 and 4/1  Preterm labor symptoms and general obstetric precautions including but not limited to vaginal bleeding, contractions, leaking of fluid and fetal movement were reviewed in detail with the patient. Please refer to After Visit Summary for other counseling recommendations.   No follow-ups on file.  Future Appointments  Date Time Provider Department Center  11/11/2022  9:55 AM Corlis Hove, NP [facility]  11/19/2022 12:30 PM [facility] NURSE [facility]  11/19/2022 12:45 PM [facility] US4 [facility]    [place] Bing, MD

## 2022-11-05 LAB — GC/CHLAMYDIA PROBE AMP (~~LOC~~) NOT AT ARMC
Chlamydia: NEGATIVE
Comment: NEGATIVE
Comment: NORMAL
Neisseria Gonorrhea: NEGATIVE

## 2022-11-07 LAB — CULTURE, BETA STREP (GROUP B ONLY): Strep Gp B Culture: POSITIVE — AB

## 2022-11-08 ENCOUNTER — Encounter: Payer: Self-pay | Admitting: Obstetrics and Gynecology

## 2022-11-08 DIAGNOSIS — O99820 Streptococcus B carrier state complicating pregnancy: Secondary | ICD-10-CM | POA: Insufficient documentation

## 2022-11-11 ENCOUNTER — Encounter: Payer: Medicaid Other | Admitting: Student

## 2022-11-11 ENCOUNTER — Inpatient Hospital Stay (HOSPITAL_COMMUNITY)
Admission: AD | Admit: 2022-11-11 | Discharge: 2022-11-13 | DRG: 805 | Disposition: A | Discharge status: Home / Self Care | Payer: Medicaid Other | Attending: Obstetrics and Gynecology | Admitting: Obstetrics and Gynecology

## 2022-11-11 ENCOUNTER — Other Ambulatory Visit: Payer: Self-pay

## 2022-11-11 ENCOUNTER — Inpatient Hospital Stay (HOSPITAL_COMMUNITY): Payer: Medicaid Other | Admitting: Anesthesiology

## 2022-11-11 ENCOUNTER — Encounter (HOSPITAL_COMMUNITY): Payer: Self-pay | Admitting: Obstetrics and Gynecology

## 2022-11-11 DIAGNOSIS — O4292 Full-term premature rupture of membranes, unspecified as to length of time between rupture and onset of labor: Secondary | ICD-10-CM | POA: Diagnosis present

## 2022-11-11 DIAGNOSIS — Z3490 Encounter for supervision of normal pregnancy, unspecified, unspecified trimester: Secondary | ICD-10-CM | POA: Diagnosis present

## 2022-11-11 DIAGNOSIS — O99824 Streptococcus B carrier state complicating childbirth: Secondary | ICD-10-CM | POA: Diagnosis present

## 2022-11-11 DIAGNOSIS — O4593 Premature separation of placenta, unspecified, third trimester: Secondary | ICD-10-CM | POA: Diagnosis present

## 2022-11-11 DIAGNOSIS — O9081 Anemia of the puerperium: Secondary | ICD-10-CM | POA: Diagnosis not present

## 2022-11-11 DIAGNOSIS — O3413 Maternal care for benign tumor of corpus uteri, third trimester: Secondary | ICD-10-CM | POA: Diagnosis present

## 2022-11-11 DIAGNOSIS — O09523 Supervision of elderly multigravida, third trimester: Secondary | ICD-10-CM

## 2022-11-11 DIAGNOSIS — O3663X0 Maternal care for excessive fetal growth, third trimester, not applicable or unspecified: Secondary | ICD-10-CM | POA: Diagnosis present

## 2022-11-11 DIAGNOSIS — D62 Acute posthemorrhagic anemia: Secondary | ICD-10-CM | POA: Diagnosis not present

## 2022-11-11 DIAGNOSIS — Z3A37 37 weeks gestation of pregnancy: Secondary | ICD-10-CM

## 2022-11-11 DIAGNOSIS — O3660X0 Maternal care for excessive fetal growth, unspecified trimester, not applicable or unspecified: Secondary | ICD-10-CM | POA: Diagnosis present

## 2022-11-11 DIAGNOSIS — O4290 Premature rupture of membranes, unspecified as to length of time between rupture and onset of labor, unspecified weeks of gestation: Secondary | ICD-10-CM | POA: Diagnosis present

## 2022-11-11 DIAGNOSIS — O26893 Other specified pregnancy related conditions, third trimester: Secondary | ICD-10-CM | POA: Diagnosis present

## 2022-11-11 DIAGNOSIS — O9902 Anemia complicating childbirth: Secondary | ICD-10-CM | POA: Diagnosis present

## 2022-11-11 DIAGNOSIS — O4202 Full-term premature rupture of membranes, onset of labor within 24 hours of rupture: Secondary | ICD-10-CM

## 2022-11-11 DIAGNOSIS — D259 Leiomyoma of uterus, unspecified: Secondary | ICD-10-CM | POA: Diagnosis present

## 2022-11-11 DIAGNOSIS — O99820 Streptococcus B carrier state complicating pregnancy: Secondary | ICD-10-CM

## 2022-11-11 LAB — CBC
HCT: 35.6 % — ABNORMAL LOW (ref 36.0–46.0)
Hemoglobin: 11.5 g/dL — ABNORMAL LOW (ref 12.0–15.0)
MCH: 28 pg (ref 26.0–34.0)
MCHC: 32.3 g/dL (ref 30.0–36.0)
MCV: 86.6 fL (ref 80.0–100.0)
Platelets: 189 10*3/uL (ref 150–400)
RBC: 4.11 MIL/uL (ref 3.87–5.11)
RDW: 14.9 % (ref 11.5–15.5)
WBC: 4.8 10*3/uL (ref 4.0–10.5)
nRBC: 0 % (ref 0.0–0.2)

## 2022-11-11 LAB — TYPE AND SCREEN
ABO/RH(D): B POS
Antibody Screen: NEGATIVE

## 2022-11-11 LAB — POCT FERN TEST: POCT Fern Test: NEGATIVE

## 2022-11-11 LAB — RPR: RPR Ser Ql: NONREACTIVE

## 2022-11-11 MED ORDER — LACTATED RINGERS IV SOLN
500.0000 mL | INTRAVENOUS | Status: DC | PRN
  Administered 2022-11-11: 500 mL via INTRAVENOUS

## 2022-11-11 MED ORDER — LACTATED RINGERS IV SOLN: INTRAVENOUS | Status: DC

## 2022-11-11 MED ORDER — TETANUS-DIPHTH-ACELL PERTUSSIS 5-2.5-18.5 LF-MCG/0.5 IM SUSY: 0.5000 mL | PREFILLED_SYRINGE | Freq: Once | INTRAMUSCULAR | Status: DC

## 2022-11-11 MED ORDER — OXYCODONE-ACETAMINOPHEN 5-325 MG PO TABS: 1.0000 | ORAL_TABLET | ORAL | Status: DC | PRN

## 2022-11-11 MED ORDER — FENTANYL CITRATE (PF) 100 MCG/2ML IJ SOLN: 50.0000 ug | INTRAMUSCULAR | Status: DC | PRN

## 2022-11-11 MED ORDER — WITCH HAZEL-GLYCERIN EX PADS: 1.0000 | MEDICATED_PAD | CUTANEOUS | Status: DC | PRN

## 2022-11-11 MED ORDER — PHENYLEPHRINE 80 MCG/ML (10ML) SYRINGE FOR IV PUSH (FOR BLOOD PRESSURE SUPPORT): 80.0000 ug | PREFILLED_SYRINGE | INTRAVENOUS | Status: DC | PRN

## 2022-11-11 MED ORDER — ONDANSETRON HCL 4 MG PO TABS: 4.0000 mg | ORAL_TABLET | ORAL | Status: DC | PRN

## 2022-11-11 MED ORDER — LACTATED RINGERS IV SOLN
500.0000 mL | Freq: Once | INTRAVENOUS | Status: AC
  Administered 2022-11-11: 500 mL via INTRAVENOUS

## 2022-11-11 MED ORDER — ZOLPIDEM TARTRATE 5 MG PO TABS: 5.0000 mg | ORAL_TABLET | Freq: Every evening | ORAL | Status: DC | PRN

## 2022-11-11 MED ORDER — PENICILLIN G POT IN DEXTROSE 60000 UNIT/ML IV SOLN
3.0000 10*6.[IU] | INTRAVENOUS | Status: DC
  Administered 2022-11-11 (×2): 3 10*6.[IU] via INTRAVENOUS
  Filled 2022-11-11 (×2): qty 50

## 2022-11-11 MED ORDER — SENNOSIDES-DOCUSATE SODIUM 8.6-50 MG PO TABS
2.0000 | ORAL_TABLET | Freq: Every day | ORAL | Status: DC
  Administered 2022-11-12 – 2022-11-13 (×2): 2 via ORAL
  Filled 2022-11-11 (×2): qty 2

## 2022-11-11 MED ORDER — ACETAMINOPHEN 325 MG PO TABS: 650.0000 mg | ORAL_TABLET | ORAL | Status: DC | PRN

## 2022-11-11 MED ORDER — TRANEXAMIC ACID-NACL 1000-0.7 MG/100ML-% IV SOLN
INTRAVENOUS | Status: AC
  Administered 2022-11-11: 1000 mg via INTRAVENOUS
  Filled 2022-11-11: qty 100

## 2022-11-11 MED ORDER — ONDANSETRON HCL 4 MG/2ML IJ SOLN: 4.0000 mg | INTRAMUSCULAR | Status: DC | PRN

## 2022-11-11 MED ORDER — MISOPROSTOL 50MCG HALF TABLET
50.0000 ug | ORAL_TABLET | ORAL | Status: DC | PRN
  Administered 2022-11-11: 50 ug via ORAL
  Filled 2022-11-11: qty 1

## 2022-11-11 MED ORDER — ONDANSETRON HCL 4 MG/2ML IJ SOLN: 4.0000 mg | Freq: Four times a day (QID) | INTRAMUSCULAR | Status: DC | PRN

## 2022-11-11 MED ORDER — OXYTOCIN-SODIUM CHLORIDE 30-0.9 UT/500ML-% IV SOLN
2.5000 [IU]/h | INTRAVENOUS | Status: DC
  Administered 2022-11-11: 2.5 [IU]/h via INTRAVENOUS
  Filled 2022-11-11: qty 500

## 2022-11-11 MED ORDER — OXYTOCIN BOLUS FROM INFUSION
333.0000 mL | Freq: Once | INTRAVENOUS | Status: AC
  Administered 2022-11-11: 333 mL via INTRAVENOUS

## 2022-11-11 MED ORDER — MISOPROSTOL 50MCG HALF TABLET: 50.0000 ug | ORAL_TABLET | Freq: Once | ORAL | Status: DC

## 2022-11-11 MED ORDER — TERBUTALINE SULFATE 1 MG/ML IJ SOLN: 0.2500 mg | Freq: Once | INTRAMUSCULAR | Status: DC | PRN

## 2022-11-11 MED ORDER — DIPHENHYDRAMINE HCL 50 MG/ML IJ SOLN: 12.5000 mg | INTRAMUSCULAR | Status: DC | PRN

## 2022-11-11 MED ORDER — PRENATAL MULTIVITAMIN CH
1.0000 | ORAL_TABLET | Freq: Every day | ORAL | Status: DC
  Administered 2022-11-12 – 2022-11-13 (×2): 1 via ORAL
  Filled 2022-11-11 (×2): qty 1

## 2022-11-11 MED ORDER — SOD CITRATE-CITRIC ACID 500-334 MG/5ML PO SOLN: 30.0000 mL | ORAL | Status: DC | PRN

## 2022-11-11 MED ORDER — BENZOCAINE-MENTHOL 20-0.5 % EX AERO: 1.0000 | INHALATION_SPRAY | CUTANEOUS | Status: DC | PRN

## 2022-11-11 MED ORDER — EPHEDRINE 5 MG/ML INJ: 10.0000 mg | INTRAVENOUS | Status: DC | PRN

## 2022-11-11 MED ORDER — SIMETHICONE 80 MG PO CHEW: 80.0000 mg | CHEWABLE_TABLET | ORAL | Status: DC | PRN

## 2022-11-11 MED ORDER — LIDOCAINE-EPINEPHRINE (PF) 1.5 %-1:200000 IJ SOLN
INTRAMUSCULAR | Status: DC | PRN
  Administered 2022-11-11: 5 mL via EPIDURAL

## 2022-11-11 MED ORDER — LIDOCAINE HCL (PF) 1 % IJ SOLN: 30.0000 mL | INTRAMUSCULAR | Status: DC | PRN

## 2022-11-11 MED ORDER — OXYCODONE-ACETAMINOPHEN 5-325 MG PO TABS: 2.0000 | ORAL_TABLET | ORAL | Status: DC | PRN

## 2022-11-11 MED ORDER — DIBUCAINE (PERIANAL) 1 % EX OINT: 1.0000 | TOPICAL_OINTMENT | CUTANEOUS | Status: DC | PRN

## 2022-11-11 MED ORDER — FENTANYL-BUPIVACAINE-NACL 0.5-0.125-0.9 MG/250ML-% EP SOLN
12.0000 mL/h | EPIDURAL | Status: DC | PRN
  Administered 2022-11-11: 12 mL/h via EPIDURAL
  Filled 2022-11-11: qty 250

## 2022-11-11 MED ORDER — SODIUM CHLORIDE 0.9 % IV SOLN
5.0000 10*6.[IU] | Freq: Once | INTRAVENOUS | Status: AC
  Administered 2022-11-11: 5 10*6.[IU] via INTRAVENOUS
  Filled 2022-11-11: qty 5

## 2022-11-11 MED ORDER — DIPHENHYDRAMINE HCL 25 MG PO CAPS: 25.0000 mg | ORAL_CAPSULE | Freq: Four times a day (QID) | ORAL | Status: DC | PRN

## 2022-11-11 MED ORDER — PHENYLEPHRINE 80 MCG/ML (10ML) SYRINGE FOR IV PUSH (FOR BLOOD PRESSURE SUPPORT)
80.0000 ug | PREFILLED_SYRINGE | INTRAVENOUS | Status: DC | PRN
  Filled 2022-11-11: qty 10

## 2022-11-11 MED ORDER — COCONUT OIL OIL: 1.0000 | TOPICAL_OIL | Status: DC | PRN

## 2022-11-11 MED ORDER — OXYTOCIN-SODIUM CHLORIDE 30-0.9 UT/500ML-% IV SOLN: 1.0000 m[IU]/min | INTRAVENOUS | Status: DC

## 2022-11-11 MED ORDER — TRANEXAMIC ACID-NACL 1000-0.7 MG/100ML-% IV SOLN: 1000.0000 mg | Freq: Once | INTRAVENOUS | Status: AC

## 2022-11-11 MED ORDER — FERROUS SULFATE 325 (65 FE) MG PO TABS
325.0000 mg | ORAL_TABLET | ORAL | Status: DC
  Administered 2022-11-12: 325 mg via ORAL
  Filled 2022-11-11: qty 1

## 2022-11-11 MED ORDER — IBUPROFEN 600 MG PO TABS
600.0000 mg | ORAL_TABLET | Freq: Four times a day (QID) | ORAL | Status: DC
  Administered 2022-11-11 – 2022-11-13 (×6): 600 mg via ORAL
  Filled 2022-11-11 (×6): qty 1

## 2022-11-11 NOTE — Anesthesia Preprocedure Evaluation (Signed)
Anesthesia Evaluation  Patient identified by MRN, date of birth, ID band Patient awake    Reviewed: Allergy & Precautions, NPO status , Patient's Chart, lab work & pertinent test results  Airway Mallampati: II  TM Distance: >3 FB Neck ROM: Full    Dental no notable dental hx.    Pulmonary neg pulmonary ROS   Pulmonary exam normal        Cardiovascular negative cardio ROS  Rhythm:Regular Rate:Normal     Neuro/Psych negative neurological ROS  negative psych ROS   GI/Hepatic Neg liver ROS,GERD  ,,  Endo/Other  negative endocrine ROS    Renal/GU negative Renal ROS  negative genitourinary   Musculoskeletal   Abdominal Normal abdominal exam  (+)   Peds  Hematology negative hematology ROS (+)   Anesthesia Other Findings   Reproductive/Obstetrics (+) Pregnancy                             Anesthesia Physical Anesthesia Plan  ASA: 2  Anesthesia Plan: Epidural   Post-op Pain Management:    Induction:   PONV Risk Score and Plan: 2 and Treatment may vary due to age or medical condition  Airway Management Planned: Natural Airway  Additional Equipment: None  Intra-op Plan:   Post-operative Plan:   Informed Consent: I have reviewed the patients History and Physical, chart, labs and discussed the procedure including the risks, benefits and alternatives for the proposed anesthesia with the patient or authorized representative who has indicated his/her understanding and acceptance.     Dental advisory given  Plan Discussed with:   Anesthesia Plan Comments:        Anesthesia Quick Evaluation

## 2022-11-11 NOTE — H&P (Addendum)
OBSTETRIC ADMISSION HISTORY AND PHYSICAL  Nicole Lindsey is a 37 y.o. female G2P1001 with IUP at [redacted]w[redacted]d by LMP presenting for SROM @ 0500.   Reports fetal movement. Moderate amount of vaginal bleeding.  She received her prenatal care at  [facility] .  Support person in labor: Diddy - FOB  Ultrasounds Anatomy U/S: Normal Growth U/S @ 34.4 wks: 3191 gm (99% tile) - pelvis proven to 3600 gm  Prenatal History/Complications: AMA Uterine Fibroids GBS Positive LGA GERD  OB BOX: Nursing Staff Provider  Office Location [facility] Dating  11/28/2022, by Last Menstrual Period  [facility] Model  Traditional  Centering  Mom-Baby Dyad Anatomy US  Normal anatomy  Language  Kinyarwanda     Flu Vaccine  06/06/22 Genetic/Carrier Screen  NIPS:   Low Risk  AFP:   Negative Horizon: Negative  TDaP Vaccine    Hgb A1C or  GTT Early  Third trimester  normal 2 hr gtt  COVID Vaccine    LAB RESULTS   Rhogam    N/A--B Positive  Blood Type  B Positive   Baby Feeding Plan Breast Antibody  Negative   Contraception Yes, IUD Rubella  Immune  Circumcision If boy, Yes RPR  Non-Reactive  [facility]ian  [facility]  HBsAg  Negative  Support Person  HCVAb  Non-Reactive  Prenatal Classes  HIV  Non-Reactive   BTL Consent  GBS  pos  VBAC Consent  Pap Diagnosis  Date Value Ref Range Status  01/25/2021   Final   - Negative for intraepithelial lesion or malignancy (NILM)         DME Rx  BP cuff  Weight Scale Waterbirth   Class  Consent  CNM visit  PHQ9 & GAD7 [  ] new OB [  ] 28 weeks  [  ] 36 weeks Induction   Orders Entered [place] Y/N   Past Medical History: Past Medical History:  Diagnosis Date   Fibroid    History of gestational diabetes 05/28/2022   Uterine fibroids affecting pregnancy in third trimester 02/27/2020    Past Surgical History: Past Surgical History:  Procedure Laterality Date   NO PAST SURGERIES      Obstetrical History: OB History     Gravida  2    Para  1   Term  1   Preterm  0   AB  0   Living  1      SAB  0   IAB  0   Ectopic  0   Multiple  0   Live Births  1           Social History: Social History   Socioeconomic History   Marital status: Married    Spouse name: Not on file   Number of children: Not on file   Years of education: Not on file   Highest education level: Not on file  Occupational History   Not on file  Tobacco Use   Smoking status: Never   Smokeless tobacco: Never  Vaping Use   Vaping Use: Never used  Substance and Sexual Activity   Alcohol use: Not Currently   Drug use: Not Currently   Sexual activity: Yes  Other Topics Concern   Not on file  Social History Narrative   Not on file   Social Determinants of Health   Financial Resource Strain: Not on file  Food Insecurity: No Food Insecurity (11/11/2022)  Hunger Vital Sign    Worried About Running Out of Food in the Last Year: Never true    Ran Out of Food in the Last Year: Never true  Transportation Needs: No Transportation Needs (11/11/2022)   PRAPARE - Administrator, Civil Service (Medical): No    Lack of Transportation (Non-Medical): No  Physical Activity: Not on file  Stress: Not on file  Social Connections: Not on file    Family History: Family History  Problem Relation Age of Onset   Asthma Neg Hx    Cancer Neg Hx    Diabetes Neg Hx    Heart disease Neg Hx    Hypertension Neg Hx     Allergies: No Known Allergies  Medications Prior to Admission  Medication Sig Dispense Refill Last Dose   aspirin EC 81 MG tablet Take 1 tablet (81 mg total) by mouth daily. Take after 12 weeks for prevention of preeclampsia later in pregnancy 300 tablet 2 Past Week   famotidine (PEPCID) 20 MG tablet Take 1 tablet (20 mg total) by mouth 2 (two) times daily as needed for heartburn or indigestion. 30 tablet 3 11/10/2022   Prenatal Vit-Fe Fumarate-FA (PRENATAL VITAMIN) 27-0.8 MG TABS Take 1 tablet by mouth daily. 30  tablet 11 11/10/2022   Blood Pressure Monitoring (BLOOD PRESSURE KIT) DEVI 1 Device by Does not apply route as needed. 1 each 0    Misc. Devices (GOJJI WEIGHT SCALE) MISC 1 Device by Does not apply route as needed. (Patient not taking: Reported on 11/04/2022) 1 each 0      Review of Systems  All systems reviewed and negative except as stated in HPI  Blood pressure (!) 108/52, pulse 67, temperature 97.6 F (36.4 C), temperature source Oral, resp. rate 18, height 6' (1.829 m), weight 95 kg, last menstrual period 02/21/2022, SpO2 100 %, currently breastfeeding. General appearance: alert, cooperative, and no distress Lungs: no respiratory distress Heart: regular rate  Abdomen: soft, non-tender; gravid Pelvic: adequate Extremities: Homans sign is negative, no sign of DVT Presentation: cephalic Fetal monitoring: Baseline rate 130 bpm   Variability moderate  Accelerations present   Decelerations none Uterine activity: irregular every 10-12 mins with UI noted  Dilation: Fingertip Effacement (%): Thick Station: -3 Exam by:: Arita Miss, CNM  Prenatal labs: ABO, Rh: --/--/B POS (04/22 1610) Antibody: NEG (04/22 9604) Rubella: 8.02 (11/07 1141) IMMUNE RPR: Non Reactive (02/06 1159)  HBsAg: Negative (11/07 1141)  HIV: Non Reactive (02/06 1331)  GBS: Positive/-- (04/15 1210)  Glucola: Normal [phone] Genetic screening:  Low-Risk  Prenatal Transfer Tool  Maternal Diabetes: No Genetic Screening: Normal Maternal Ultrasounds/Referrals: Normal Fetal Ultrasounds or other Referrals:  None Maternal Substance Abuse:  No Significant Maternal Medications:  None Significant Maternal Lab Results: Group B Strep positive  Results for orders placed or performed during the hospital encounter of 11/11/22 (from the past 24 hour(s))  POCT fern test   Collection Time: 11/11/22  8:00 AM  Result Value Ref Range   POCT Fern Test Negative = intact amniotic membranes   CBC   Collection Time: 11/11/22  8:14 AM   Result Value Ref Range   WBC 4.8 4.0 - 10.5 K/uL   RBC 4.11 3.87 - 5.11 MIL/uL   Hemoglobin 11.5 (L) 12.0 - 15.0 g/dL   HCT 14.7 (L) 82.9 - 56.2 %   MCV 86.6 80.0 - 100.0 fL   MCH 28.0 26.0 - 34.0 pg   MCHC 32.3 30.0 - 36.0 g/dL  RDW 14.9 11.5 - 15.5 %   Platelets 189 150 - 400 K/uL   nRBC 0.0 0.0 - 0.2 %  Type and screen [facility]   Collection Time: 11/11/22  8:14 AM  Result Value Ref Range   ABO/RH(D) B POS    Antibody Screen NEG    Sample Expiration      11/14/2022,2359 Performed at [facility], [address], [place], [place] [address]     Patient Active Problem List   Diagnosis Date Noted   Encounter for induction of labor 11/11/2022   GBS (group B Streptococcus carrier), +RV culture, currently pregnant 11/08/2022   LGA (large for gestational age) fetus affecting management of mother 09/24/2022   Uterine fibroids affecting pregnancy in second trimester 07/14/2022   Supervision of high risk pregnancy, antepartum 05/28/2022   AMA (advanced maternal age) multigravida 35+ 05/28/2022   GERD (gastroesophageal reflux disease) 01/17/2022   Non-English speaking patient 01/03/2019    Assessment/Plan:  Frederick Klinger is a 37 y.o. G2P1001 at [redacted]w[redacted]d here for SROM  and VB  Labor: early -- plan Cytotec 50 mcg po every 4 hours  FB insertion at 1 cm  Pitocin when FB dislodged -- pain control: planning epidural  Fetal Wellbeing: EFW 7.5 lbs by Leopold's. Cephalic by U/S by S. Suzie Portela, CNM.  -- GBS (Positive) -- continuous fetal monitoring - category 1   Postpartum Planning -- breast -- IUD for contraception (OP)   Raelyn Mora, CNM  11/11/2022, 8:42 AM

## 2022-11-11 NOTE — Progress Notes (Signed)
Nicole Lindsey is a 37 y.o. G2P1001 at [redacted]w[redacted]d admitted for rupture of membranes  Subjective:  Patient resting.  Objective: BP 120/64   Pulse 72   Temp 98.4 F (36.9 C) (Oral)   Resp 18   Ht 6' (1.829 m)   Wt 95 kg   LMP 02/21/2022   SpO2 100%   BMI 28.40 kg/m  No intake/output data recorded. No intake/output data recorded.  FHT:  FHR: 125 bpm, variability: moderate,  accelerations:  Present,  decelerations:  Absent UC:   regular, every 2-7 minutes SVE:   Dilation: 4.5 Effacement (%): 80 Station: -1 Exam by:: Raelyn Mora, CNM  Labs: Lab Results  Component Value Date   WBC 4.8 11/11/2022   HGB 11.5 (L) 11/11/2022   HCT 35.6 (L) 11/11/2022   MCV 86.6 11/11/2022   PLT 189 11/11/2022    Assessment / Plan: Spontaneous labor, progressing normally  Labor: Progressing normally Preeclampsia:   n/a Fetal Wellbeing:  Category I Pain Control:   Planning Epidural I/D:   GBS Pos Anticipated MOD:  NSVD  Raelyn Mora, CNM 11/11/2022, 2:10 PM

## 2022-11-11 NOTE — Anesthesia Procedure Notes (Signed)
Epidural Patient location during procedure: OB Start time: 11/11/2022 3:00 PM End time: 11/11/2022 3:12 PM  Staffing Anesthesiologist: Atilano Median, DO Performed: anesthesiologist   Preanesthetic Checklist Completed: patient identified, IV checked, site marked, risks and benefits discussed, surgical consent, monitors and equipment checked, pre-op evaluation and timeout performed  Epidural Patient position: sitting Prep: ChloraPrep Patient monitoring: heart rate, continuous pulse ox and blood pressure Approach: midline Location: L4-L5 Injection technique: LOR saline  Needle:  Needle type: Tuohy  Needle gauge: 17 G Needle length: 9 cm Needle insertion depth: 7 cm Catheter type: closed end flexible Catheter size: 20 Guage Catheter at skin depth: 12 cm Test dose: negative and 1.5% lidocaine with Epi 1:200 K  Assessment Events: blood not aspirated, no cerebrospinal fluid, injection not painful, no injection resistance and no paresthesia  Additional Notes Patient identified. Risks/Benefits/Options discussed with patient including but not limited to bleeding, infection, nerve damage, paralysis, failed block, incomplete pain control, headache, blood pressure changes, nausea, vomiting, reactions to medications, itching and postpartum back pain. Confirmed with bedside nurse the patient's most recent platelet count. Confirmed with patient that they are not currently taking any anticoagulation, have any bleeding history or any family history of bleeding disorders. Patient expressed understanding and wished to proceed. All questions were answered. Sterile technique was used throughout the entire procedure. Please see nursing notes for vital signs. Test dose was given through epidural catheter and negative prior to continuing to dose epidural or start infusion. Warning signs of high block given to the patient including shortness of breath, tingling/numbness in hands, complete motor block,  or any concerning symptoms with instructions to call for help. Patient was given instructions on fall risk and not to get out of bed. All questions and concerns addressed with instructions to call with any issues or inadequate analgesia.    Reason for block:procedure for pain

## 2022-11-11 NOTE — MAU Provider Note (Addendum)
Event Date/Time   First Provider Initiated Contact with Patient 11/11/22 [phone]       S: [name] is a 37 y.o. G2P1001 at [redacted]w[redacted]d  who presents to MAU today complaining of leaking of fluid since 5am . She endorses vaginal bleeding. She denies contractions. She reports normal fetal movement.    O: BP 118/66   Pulse 76   Temp 97.8 F (36.6 C) (Oral)   Resp 16   Ht 6' (1.829 m)   Wt 95 kg   LMP 02/21/2022   SpO2 100%   BMI 28.40 kg/m  GENERAL: Well-developed, well-nourished female in no acute distress.  HEAD: Normocephalic, atraumatic.  CHEST: Normal effort of breathing, regular heart rate ABDOMEN: Soft, nontender, gravid PELVIC: Normal external female genitalia. Vagina is pink and rugated. Bright red bleeding with mucous like clots noted on speculum exam. Cervix barely visible d/t vaginal walls caving in on exam.  Negative pooling. Fern collected.   Cervical exam: Difficult exam  Dilation: Fingertip Exam by:: Nicole Lindsey, CNM   Fetal Monitoring: Baseline: 135bpm  Variability: moderate Accelerations: 15x15 present  Decelerations: variable x1  Contractions: irregular   No results found for this or any previous visit (from the past 24 hour(s)).   A: SIUP at [redacted]w[redacted]d  Fern Negative x3  - Patient provided video imagining of a significant amount of  bloody watery splatter on the floor in her home and significant amount of bleeding in the toilet from this morning at the time of rupture.  - Admit to L&D for vaginal bleeding at 37w 4d.   - Transfer of Care to Dr. Adrian Blackwater @ 8:02 AM  Nicole Lindsey, [facility] 11/11/2022 7:58 AM  Pt informed that the ultrasound is considered a limited OB ultrasound and is not intended to be a complete ultrasound exam.  Patient also informed that the ultrasound is not being completed with the intent of assessing for fetal or placental anomalies or any pelvic abnormalities.  Explained that the purpose of today's ultrasound is to assess for   presentation and AFI.  Patient acknowledges the purpose of the exam and the limitations of the study.    Baby vertex. Had a single 2cm pocket of fluid in the RUQ. No other pocket of fluid.    Will admit to labor floor due to amount of bleeding.  Nicole Heritage, DO  11/11/2022 8:13 AM

## 2022-11-11 NOTE — MAU Note (Signed)
..  Nicole Lindsey is a 37 y.o. at [redacted]w[redacted]d here in MAU reporting: leaking of clear fluid- ruptured at 5 am.   +FM. Reports some abdominal discomfort but denies contractions. Reports some vaginal bleeding.   Pain score: 1/10 Vitals:   11/11/22 0639  BP: 114/66  Pulse: 82  Resp: 16  Temp: 97.8 F (36.6 C)  SpO2: 100%     FHT:135 Lab orders placed from triage:  fern

## 2022-11-12 DIAGNOSIS — O9081 Anemia of the puerperium: Secondary | ICD-10-CM | POA: Diagnosis not present

## 2022-11-12 LAB — CBC
HCT: 28.5 % — ABNORMAL LOW (ref 36.0–46.0)
Hemoglobin: 9.4 g/dL — ABNORMAL LOW (ref 12.0–15.0)
MCH: 28.3 pg (ref 26.0–34.0)
MCHC: 33 g/dL (ref 30.0–36.0)
MCV: 85.8 fL (ref 80.0–100.0)
Platelets: 167 10*3/uL (ref 150–400)
RBC: 3.32 MIL/uL — ABNORMAL LOW (ref 3.87–5.11)
RDW: 15 % (ref 11.5–15.5)
WBC: 8.4 10*3/uL (ref 4.0–10.5)
nRBC: 0 % (ref 0.0–0.2)

## 2022-11-12 NOTE — Anesthesia Postprocedure Evaluation (Signed)
Anesthesia Post Note  Patient: Nicole Lindsey  Procedure(s) Performed: AN AD HOC LABOR EPIDURAL     Patient location during evaluation: Mother Baby Anesthesia Type: Epidural Level of consciousness: awake and alert Pain management: pain level controlled Vital Signs Assessment: post-procedure vital signs reviewed and stable Respiratory status: spontaneous breathing, nonlabored ventilation and respiratory function stable Cardiovascular status: stable Postop Assessment: no headache, no backache and epidural receding Anesthetic complications: no   No notable events documented.  Last Vitals:  Vitals:   11/12/22 0608 11/12/22 1006  BP: (!) 114/53 108/65  Pulse: 66 68  Resp: 16 16  Temp: 37.1 C 37.1 C  SpO2: 100% 100%    Last Pain:  Vitals:   11/12/22 1006  TempSrc: Oral  PainSc:    Pain Goal: Patients Stated Pain Goal: 0 (11/11/22 0640)                 Fanny Dance

## 2022-11-12 NOTE — Lactation Note (Signed)
This note was copied from a baby's chart. Lactation Consultation Note  Patient Name: Nicole Lindsey Posten ZOXWR'U Date: 11/12/2022 Age:37 hours Reason for consult:  (MBU Nurse Mertha Baars is aware to ask mom the if she would like to see [facility] since she declined in L/D to be seen by [facility],)      Consult Status      Matilde Sprang [facility] 11/12/2022, 11:25 AM

## 2022-11-12 NOTE — Progress Notes (Signed)
Post Partum Day 1  Used the help of a [employer] video Kinyarwanda interpreter.  Subjective: No complaints, up ad lib, voiding, and tolerating PO. Breastfeeding.  Moderate lochia reported.  Objective: Blood pressure 108/65, pulse 68, temperature 98.7 F (37.1 C), temperature source Oral, resp. rate 16, height 6' (1.829 m), weight 95 kg, last menstrual period 02/21/2022, SpO2 100 %, unknown if currently breastfeeding.  Physical Exam:  General: alert and no distress Lochia: appropriate Uterine Fundus: firm, nontender DVT Evaluation: No evidence of DVT seen on physical exam.  Negative Homan's sign. No cords or calf tenderness.  No significant calf/ankle edema.  Recent Labs    11/11/22 0814 11/12/22 0608  HGB 11.5* 9.4*  HCT 35.6* 28.5*    Assessment/Plan: Breastfeeding, Lactation consult, and Contraception outpatient IUD Patient desires circumcision for her female infant.  Circumcision procedure details discussed, risks and benefits of procedure were also discussed.  These include but are not limited to: Benefits of circumcision in men include reduction in the rates of urinary tract infection (UTI), penile cancer, some sexually transmitted infections, penile inflammatory and retractile disorders, as well as easier hygiene.  Risks include bleeding , infection, injury of glans which may lead to penile deformity or urinary tract issues, unsatisfactory cosmetic appearance and other potential complications related to the procedure.  It was emphasized that this is an elective procedure.  Patient wants to proceed with circumcision; written informed consent obtained.  Will do circumcision soon, routine circumcision and post circumcision care ordered for the infant. Routine postpartum care, plan to discharge tomorrow if remains stable.    LOS: 1 day   Jaynie Collins, MD, FACOG Obstetrician & Gynecologist, [facility] for [employer], [facility]

## 2022-11-12 NOTE — Lactation Note (Addendum)
This note was copied from a baby's chart. Lactation Consultation Note  Patient Name: Boy Breanda Greenlaw ZOXWR'U Date: 11/12/2022 Age:37 hours   RN [facility]) will call LC back if Birth Parent would like to be seen by [facility] services tonight. Birth Parent had declined in L&D.   Maternal Data    Feeding    LATCH Score   Lactation Tools Discussed/Used    Interventions    Discharge    Consult Status      Frederico Hamman 11/12/2022, 1:47 AM

## 2022-11-12 NOTE — Lactation Note (Signed)
This note was copied from a baby's chart. Lactation Consultation Note  Patient Name: Nicole Lindsey [id number]'U Date: 11/12/2022 Age:37 hours, P2, experienced x 16 months  Reason for consult: Initial assessment;Early term 37-38.6wks (per Nurse Earnestine Leys , mom would like to see [facility].) LC reviewed and updated the doc flow sheets per mom.  Per mom desired to show the [facility] she could hand express, excellent flow.  Mom initially latched shallow and LC showed her how to get better depth. More swallows and baby released after 3-4 swallows.  Mom aware she can call for Latch assistance.   Maternal Data Has patient been taught Hand Expression?: Yes Does the patient have breastfeeding experience prior to this delivery?: Yes How long did the patient breastfeed?: per mom 16 months total and the 1st 6 months exclusive Breastfeeding  Feeding Mother's Current Feeding Choice: Breast Milk  LATCH Score Latch: Grasps breast easily, tongue down, lips flanged, rhythmical sucking.  Audible Swallowing: Spontaneous and intermittent  Type of Nipple: Everted at rest and after stimulation  Comfort (Breast/Nipple): Soft / non-tender  Hold (Positioning): Assistance needed to correctly position infant at breast and maintain latch.  LATCH Score: 9   Lactation Tools Discussed/Used    Interventions Interventions: Breast feeding basics reviewed;Assisted with latch;Skin to skin;Adjust position;Support pillows;Education;LC Services brochure  Discharge Pump: Personal;Manual;DEBP  Consult Status Consult Status: Follow-up Date: 11/12/22 Follow-up type: In-patient    Nicole Lindsey 11/12/2022, 11:51 AM

## 2022-11-12 NOTE — Discharge Summary (Signed)
Postpartum Discharge Summary  Date of Service updated***     Patient Name: Nicole Lindsey DOB: Jun 22, 1986 MRN: [id number]  Date of admission: 11/11/2022 Delivery date:11/11/2022  Delivering provider: Raelyn Mora  Date of discharge: 11/12/2022  Admitting diagnosis: Encounter for induction of labor [Z34.90] Intrauterine pregnancy: [redacted]w[redacted]d     Secondary diagnosis:  Active Problems:   Ruptured membranes, prolonged  Additional problems: Placental Abruption, Uterine Fibroids, AMA    Discharge diagnosis: Term Pregnancy Delivered                                              Post partum procedures:{Postpartum procedures:23558} Augmentation: N/A Complications: Placental Abruption  Hospital course: Onset of Labor With Vaginal Delivery      37 y.o. yo [id number] at [redacted]w[redacted]d was admitted in Active Labor on 11/11/2022. Labor course was complicated by placental abruption  Membrane Rupture Time/Date: 5:00 AM ,11/11/2022   Delivery Method:Vaginal, Spontaneous  Episiotomy: None  Lacerations:  None  Patient had a postpartum course complicated by ***.  She is ambulating, tolerating a regular diet, passing flatus, and urinating well. Patient is discharged home in stable condition on 11/12/22.  Newborn Data: Birth date:11/11/2022  Birth time:7:57 PM  Gender:Female  Living status:Living  Apgars:8 ,9  Weight:3300 g   Magnesium Sulfate received: No BMZ received: No Rhophylac:N/A MMR:No T-DaP: not given Flu: No Transfusion:{Transfusion received:30440034}  Physical exam  Vitals:   11/11/22 2233 11/11/22 2327 11/12/22 0329 11/12/22 0608  BP: (!) 106/59 (!) 111/57 (!) 102/53 (!) 114/53  Pulse: 68 72 68 66  Resp: Temp: 98.7 F (37.1 C) 98.4 F (36.9 C) 98 F (36.7 C) 98.7 F (37.1 C)  TempSrc: Oral Oral Oral Oral  SpO2:    100%  Weight:      Height:       General: {Exam; general:21111117} Lochia: {Desc; appropriate/inappropriate:30686::"appropriate"} Uterine Fundus: {Desc;  firm/soft:30687} Incision: {Exam; incision:21111123} DVT Evaluation: {Exam; dvt:2111122} Labs: Lab Results  Component Value Date   WBC 8.4 11/12/2022   HGB 9.4 (L) 11/12/2022   HCT 28.5 (L) 11/12/2022   MCV 85.8 11/12/2022   PLT 167 11/12/2022      Latest Ref Rng & Units 08/04/2020    1:36 AM  CMP  Glucose 70 - 99 mg/dL 147   BUN 6 - 20 mg/dL 5   Creatinine 8.29 - 5.62 mg/dL 1.30   Sodium 865 - 784 mmol/L 131   Potassium 3.5 - 5.1 mmol/L 3.8   Chloride 98 - 111 mmol/L 101   CO2 22 - 32 mmol/L 17   Calcium 8.9 - 10.3 mg/dL 9.5   Total Protein 6.5 - 8.1 g/dL 7.6   Total Bilirubin 0.3 - 1.2 mg/dL 2.0   Alkaline Phos 38 - 126 U/L 182   AST 15 - 41 U/L 28   ALT 0 - 44 U/L 24    Edinburgh Score:    09/05/2020    8:42 AM  Edinburgh Postnatal Depression Scale Screening Tool  I have been able to laugh and see the funny side of things. 0  I have looked forward with enjoyment to things. 0  I have blamed myself unnecessarily when things went wrong. 0  I have been anxious or worried for no good reason. 0  I have felt scared or panicky for no good reason. 0  Things have been  getting on top of me. 0  I have been so unhappy that I have had difficulty sleeping. 0  I have felt sad or miserable. 0  I have been so unhappy that I have been crying. 0  The thought of harming myself has occurred to me. 0  Edinburgh Postnatal Depression Scale Total 0     After visit meds:  Allergies as of 11/12/2022   No Known Allergies   Med Rec must be completed prior to using this [facility]***        Discharge home in stable condition Infant Feeding: Breast Infant Disposition:{CHL IP OB HOME WITH KZSWFU:93235} Discharge instruction: per After Visit Summary and Postpartum booklet. Activity: Advance as tolerated. Pelvic rest for 6 weeks.  Diet: routine diet Future Appointments:No future appointments. Follow up Visit:  Message sent to [facility] on 11/12/2022 by R. Arita Miss, CNM Please schedule this  patient for a In person postpartum visit in 6 weeks with the following provider: MD. Additional Postpartum F/U: none  - needs IUD scheduled with PP visit High risk pregnancy complicated by:  AMA, Fibroids Delivery mode:  Vaginal, Spontaneous  Anticipated Birth Control:  IUD   11/12/2022 Raelyn Mora, CNM

## 2022-11-13 ENCOUNTER — Other Ambulatory Visit (HOSPITAL_COMMUNITY): Payer: Self-pay

## 2022-11-13 MED ORDER — FERROUS SULFATE 325 (65 FE) MG PO TABS
325.0000 mg | ORAL_TABLET | ORAL | 3 refills | Status: AC
  Filled 2022-11-13: qty 30, 60d supply, fill #0

## 2022-11-13 MED ORDER — IBUPROFEN 600 MG PO TABS
600.0000 mg | ORAL_TABLET | Freq: Four times a day (QID) | ORAL | 0 refills | Status: DC
  Filled 2022-11-13: qty 30, 8d supply, fill #0

## 2022-11-13 MED ORDER — BENZOCAINE-MENTHOL 20-0.5 % EX AERO
1.0000 | INHALATION_SPRAY | CUTANEOUS | 0 refills | Status: DC | PRN
  Filled 2022-11-13: qty 78, fill #0

## 2022-11-13 MED ORDER — SENNOSIDES-DOCUSATE SODIUM 8.6-50 MG PO TABS
2.0000 | ORAL_TABLET | Freq: Every day | ORAL | 0 refills | Status: DC
  Filled 2022-11-13: qty 30, 15d supply, fill #0

## 2022-11-13 MED ORDER — WITCH HAZEL-GLYCERIN EX PADS
1.0000 | MEDICATED_PAD | CUTANEOUS | 12 refills | Status: DC | PRN
  Filled 2022-11-13: qty 40, fill #0

## 2022-11-13 MED ORDER — ACETAMINOPHEN 325 MG PO TABS
650.0000 mg | ORAL_TABLET | ORAL | 0 refills | Status: DC | PRN
  Filled 2022-11-13: qty 30, 3d supply, fill #0

## 2022-11-13 NOTE — Lactation Note (Signed)
This note was copied from a baby's chart. Lactation Consultation Note  Patient Name: Nicole Lindsey ZOXWR'U Date: 11/13/2022 Age:37 hours, exp BF  Reason for consult: Follow-up assessment;Early term 37-38.6wks;Infant weight loss (7 % weight loss) LC reviewed and updated the doc flow sheets per mom  Per mom hearing more swallows when the baby is feeding.  LC praised mom for her consistent breast feeding.  LC reviewed BF D/C teaching and LC resources.  LC stressed the importance of keeping track of I/O 's x 12 days and provided worksheets to use at home.    Maternal Data    Feeding Mother's Current Feeding Choice: Breast Milk  LATCH Score - range 9-10    Interventions Interventions: Breast feeding basics reviewed;Education;LC Services brochure  Discharge Discharge Education: Engorgement and breast care;Warning signs for feeding baby Pump: Manual;Personal;DEBP  Consult Status Consult Status: Complete Date: 11/13/22    Nicole Lindsey [facility] 11/13/2022, 8:07 AM

## 2022-11-19 ENCOUNTER — Ambulatory Visit: Payer: Medicaid Other

## 2022-12-19 ENCOUNTER — Ambulatory Visit: Payer: Medicaid Other | Admitting: Family Medicine

## 2023-01-07 ENCOUNTER — Ambulatory Visit: Payer: Medicaid Other | Admitting: Family Medicine

## 2023-01-08 ENCOUNTER — Encounter: Payer: Self-pay | Admitting: Student

## 2023-01-08 ENCOUNTER — Ambulatory Visit (INDEPENDENT_AMBULATORY_CARE_PROVIDER_SITE_OTHER): Payer: Medicaid Other | Admitting: Student

## 2023-01-08 VITALS — BP 92/58 | HR 65 | Wt 203.0 lb

## 2023-01-08 DIAGNOSIS — Z30013 Encounter for initial prescription of injectable contraceptive: Secondary | ICD-10-CM | POA: Diagnosis not present

## 2023-01-08 DIAGNOSIS — K219 Gastro-esophageal reflux disease without esophagitis: Secondary | ICD-10-CM

## 2023-01-08 MED ORDER — FAMOTIDINE 20 MG PO TABS: 20.0000 mg | ORAL_TABLET | Freq: Every day | ORAL | 3 refills | Status: DC

## 2023-01-08 MED ORDER — MEDROXYPROGESTERONE ACETATE 150 MG/ML IM SUSY
150.0000 mg | PREFILLED_SYRINGE | Freq: Once | INTRAMUSCULAR | Status: AC
Start: 2023-01-08 — End: 2023-01-08
  Administered 2023-01-08: 150 mg via INTRAMUSCULAR

## 2023-01-08 NOTE — Progress Notes (Signed)
    SUBJECTIVE:   CHIEF COMPLAINT / HPI:    Epigastric pain  Intermittent for several years, worse after eating especially foods with tomato sauce No nausea or vomiting, no blood in stool, no light headedness Seen for the same in June of 2023, diagnosed with GERD, prescribed famotidine which pt states helped but she hasn't taken it since March.   Contraception No period since having baby ~ 2 months ago, has not had intercourse since having her baby. Used to take depo shot and would like to start that again.   PERTINENT  PMH / PSH: GERD  OBJECTIVE:   BP (!) 92/58   Pulse 65   Wt 203 lb (92.1 kg)   LMP 02/21/2022   SpO2 98%   BMI 27.53 kg/m    General: NAD, pleasant, able to participate in exam Cardiac: RRR, no murmurs. Respiratory: Breathing comfortably on room air Abdomen: Bowel sounds present, mild tenderness to epigastric area without guarding, soft, non distended Skin: warm and dry Neuro: alert, no obvious focal deficits Psych: Normal affect and mood  ASSESSMENT/PLAN:   GERD (gastroesophageal reflux disease) Symptoms most consistent with known history of acid reflux which has improved with famotidine in the past.  -Famotidine 20 mg daily, can increase to BID if needed -Return if symptoms do not improve or worsen -could consider labs to explore liver/gallbladder pathology, could trial PPI, consider H. Pylori testing  Encounter for initial prescription of injectable contraceptive Discussed all birth control options, patient elected to start Depo shot which she took previously. -Return in 3 months for next Depo shot     Dr. Erick Alley, DO [facility]

## 2023-01-08 NOTE — Assessment & Plan Note (Signed)
Symptoms most consistent with known history of acid reflux which has improved with famotidine in the past.  -Famotidine 20 mg daily, can increase to BID if needed -Return if symptoms do not improve or worsen -could consider labs to explore liver/gallbladder pathology, could trial PPI, consider H. Pylori testing

## 2023-01-08 NOTE — Assessment & Plan Note (Signed)
Discussed all birth control options, patient elected to start Depo shot which she took previously. -Return in 3 months for next Depo shot

## 2023-01-08 NOTE — Progress Notes (Signed)
Patient in clinic today for initiation of depo provera injection. Per orders from Dr. Yetta Barre administered in LUOQ.   Tolerated injection well. Provided with appointment reminder card. Next injection due 03/26/23-04/09/23.  Veronda Prude, RN

## 2023-01-08 NOTE — Patient Instructions (Addendum)
It was great to see you! Thank you for allowing me to participate in your care!  Our plans for today:  - Famotidine sent to pharmacy for acid reflux - Return in pain continues -You received a birth control shot today. Return in 3 months for the next one.   - Famotidine yoherejwe muri farumasi kugirango acide aside - Garuka mububabare birakomeza -Wakiriye isasu ryo Georgiann Mohs urubyaro uyu munsi. Garuka mumezi 3 kubutaha.   Take care and seek immediate care sooner if you develop any concerns.   Dr. Erick Alley, DO [facility]

## 2023-04-15 ENCOUNTER — Ambulatory Visit: Payer: Medicaid Other

## 2023-04-15 ENCOUNTER — Other Ambulatory Visit: Payer: Self-pay

## 2023-04-15 DIAGNOSIS — K219 Gastro-esophageal reflux disease without esophagitis: Secondary | ICD-10-CM

## 2023-04-15 DIAGNOSIS — Z3042 Encounter for surveillance of injectable contraceptive: Secondary | ICD-10-CM | POA: Diagnosis present

## 2023-04-15 DIAGNOSIS — Z23 Encounter for immunization: Secondary | ICD-10-CM | POA: Diagnosis not present

## 2023-04-15 LAB — POCT URINE PREGNANCY: Preg Test, Ur: NEGATIVE

## 2023-04-15 MED ORDER — FAMOTIDINE 20 MG PO TABS: 20.0000 mg | ORAL_TABLET | Freq: Every day | ORAL | 3 refills | Status: DC

## 2023-04-15 MED ORDER — MEDROXYPROGESTERONE ACETATE 150 MG/ML IM SUSY
150.0000 mg | PREFILLED_SYRINGE | Freq: Once | INTRAMUSCULAR | Status: AC
Start: 2023-04-15 — End: 2023-04-15
  Administered 2023-04-15: 150 mg via INTRAMUSCULAR

## 2023-04-15 NOTE — Progress Notes (Signed)
Patient here today for Depo Provera injection and is not within her dates.  Urine pregnancy obtained and was negative. Patient denies sexual activity in the last two weeks.   Last contraceptive appt was 01/08/23.   Depo given in RUOQ today.  Site unremarkable & patient tolerated injection. Advised patient use protection for at least seven days due to being outside of her depo dates.  Patient verbalizes understanding.   Patient also requests flu vaccination. Administered in LD, site unremarkable, tolerated injection well.   Next injection due 07/01/23-07/15/23.  Scheduled patient's next nurse visit for 07/01/23 at 1030. Reminder card given.    Kinyarwanda interpreter, Angelique [phone], used for entire visit.   Veronda Prude, RN

## 2023-07-01 ENCOUNTER — Other Ambulatory Visit: Payer: Self-pay

## 2023-07-01 ENCOUNTER — Ambulatory Visit: Payer: Medicaid Other

## 2023-07-01 DIAGNOSIS — Z3042 Encounter for surveillance of injectable contraceptive: Secondary | ICD-10-CM | POA: Diagnosis present

## 2023-07-01 DIAGNOSIS — K219 Gastro-esophageal reflux disease without esophagitis: Secondary | ICD-10-CM

## 2023-07-01 MED ORDER — MEDROXYPROGESTERONE ACETATE 150 MG/ML IM SUSP
150.0000 mg | Freq: Once | INTRAMUSCULAR | Status: AC
Start: 2023-07-01 — End: 2023-07-01
  Administered 2023-07-01: 150 mg via INTRAMUSCULAR

## 2023-07-01 MED ORDER — FAMOTIDINE 20 MG PO TABS: 20.0000 mg | ORAL_TABLET | Freq: Every day | ORAL | 3 refills | Status: DC

## 2023-07-01 NOTE — Progress Notes (Signed)
Patient here today for Depo Provera injection and is within her dates.    Last contraceptive appt was 01/08/2023  Depo given in LUOQ today.  Site unremarkable & patient tolerated injection.    Next injection due 09/16/23-09/30/23.  Reminder card given.    Kinyarwanda interpreter, [place] [phone], used for entire visit.   Veronda Prude, RN

## 2023-08-21 NOTE — Patient Instructions (Incomplete)
It was great to see you! Thank you for allowing me to participate in your care!  I recommend that you always bring your medications to each appointment as this makes it easy to ensure we are on the correct medications and helps [place] not miss when refills are needed.  Our plans for today:  - Shoulder Pain -   We are checking some labs today, I will call you if they are abnormal will send you a MyChart message or a letter if they are normal.  If you do not hear about your labs in the next 2 weeks please let us know.***  Take care and seek immediate care sooner if you develop any concerns.   Dr. Bess Kinds, MD [facility]

## 2023-08-21 NOTE — Progress Notes (Deleted)
  SUBJECTIVE:   CHIEF COMPLAINT / HPI:   Shoulder Pain   PERTINENT  PMH / PSH: ***  Past Medical History:  Diagnosis Date   Fibroid    History of gestational diabetes 05/28/2022   Uterine fibroids affecting pregnancy in third trimester 02/27/2020   OBJECTIVE:  There were no vitals taken for this visit. Physical Exam   ASSESSMENT/PLAN:   Assessment & Plan  No follow-ups on file. Bess Kinds, MD 08/21/2023, 6:00 PM PGY-***, [facility] {    This will disappear when note is signed, click to select method of visit    :1}

## 2023-08-22 ENCOUNTER — Ambulatory Visit: Payer: Medicaid Other | Admitting: Student

## 2023-09-16 ENCOUNTER — Ambulatory Visit (INDEPENDENT_AMBULATORY_CARE_PROVIDER_SITE_OTHER): Payer: Medicaid Other

## 2023-09-16 ENCOUNTER — Other Ambulatory Visit: Payer: Self-pay

## 2023-09-16 DIAGNOSIS — K219 Gastro-esophageal reflux disease without esophagitis: Secondary | ICD-10-CM

## 2023-09-16 DIAGNOSIS — Z3042 Encounter for surveillance of injectable contraceptive: Secondary | ICD-10-CM

## 2023-09-16 MED ORDER — MEDROXYPROGESTERONE ACETATE 150 MG/ML IM SUSP
150.0000 mg | Freq: Once | INTRAMUSCULAR | Status: AC
Start: 2023-09-16 — End: 2023-09-16
  Administered 2023-09-16: 150 mg via INTRAMUSCULAR

## 2023-09-16 MED ORDER — FAMOTIDINE 20 MG PO TABS
20.0000 mg | ORAL_TABLET | Freq: Every day | ORAL | 3 refills | Status: DC
Start: 2023-09-16 — End: 2023-12-29

## 2023-09-16 NOTE — Progress Notes (Signed)
 Patient here today for Depo Provera injection and is within her dates.     Last contraceptive appt was 01/08/2023.   Depo given in RUOQ today.  Site unremarkable & patient tolerated injection.     Next injection due 12/02/2023-12/16/2023.  Reminder card given.     Kinyarwanda interpreter used for entire visit.

## 2023-12-12 ENCOUNTER — Ambulatory Visit (INDEPENDENT_AMBULATORY_CARE_PROVIDER_SITE_OTHER): Payer: Medicaid Other

## 2023-12-12 DIAGNOSIS — Z3042 Encounter for surveillance of injectable contraceptive: Secondary | ICD-10-CM | POA: Diagnosis present

## 2023-12-12 MED ORDER — MEDROXYPROGESTERONE ACETATE 150 MG/ML IM SUSP
150.0000 mg | Freq: Once | INTRAMUSCULAR | Status: AC
Start: 2023-12-12 — End: 2023-12-12
  Administered 2023-12-12: 150 mg via INTRAMUSCULAR

## 2023-12-12 NOTE — Progress Notes (Signed)
 Patient here today for Depo Provera  injection and is within her dates.    Last contraceptive appt was 01/08/2023. Advised patient that she would need to schedule next visit for annual PCP appointment. Patient voices understanding.   Depo given in LUOQ today.  Site unremarkable & patient tolerated injection.    Next injection due 02/27/24-03/12/24.  Reminder card given.    Kinyarwanda interpreter, Robyne Christen- [phone], used for entire visit.   Elsie Halo, RN

## 2023-12-29 ENCOUNTER — Ambulatory Visit: Payer: Self-pay | Admitting: Family Medicine

## 2023-12-29 VITALS — BP 115/61 | HR 88 | Ht 72.0 in | Wt 201.2 lb

## 2023-12-29 DIAGNOSIS — R300 Dysuria: Secondary | ICD-10-CM

## 2023-12-29 DIAGNOSIS — K219 Gastro-esophageal reflux disease without esophagitis: Secondary | ICD-10-CM

## 2023-12-29 LAB — POCT URINALYSIS DIP (MANUAL ENTRY)
Bilirubin, UA: NEGATIVE
Glucose, UA: NEGATIVE mg/dL
Ketones, POC UA: NEGATIVE mg/dL
Nitrite, UA: NEGATIVE
Protein Ur, POC: 100 mg/dL — AB
Spec Grav, UA: 1.02 (ref 1.010–1.025)
Urobilinogen, UA: 0.2 U/dL
pH, UA: 6.5 (ref 5.0–8.0)

## 2023-12-29 LAB — POCT UA - MICROSCOPIC ONLY: WBC, Ur, HPF, POC: >20 (ref 0–5)

## 2023-12-29 MED ORDER — CEFADROXIL 500 MG PO CAPS
500.0000 mg | ORAL_CAPSULE | Freq: Two times a day (BID) | ORAL | 0 refills | Status: AC
Start: 2023-12-29 — End: 2024-01-03

## 2023-12-29 NOTE — Progress Notes (Signed)
    SUBJECTIVE:   CHIEF COMPLAINT / HPI:   *Utilized in person Kinyarwanda interpreter for entirety of visit  Epigastric pain History of GERD. Ongoing since 2020. Worse over the past week. Epigastric and radiates to the back. Famotidine  helped a little at first but she stopped taking it because it did not help very much.  Denies fever, cough, congestion.  Denies shortness of breath and chest pain.  Worse with food triggers.  Dysuria Ongoing for the past few days.  Associated with suprapubic pain.  Denies vaginal discharge and flank pain.  Denies fever and N/V as above.  Depo for birth control, most recent injection on 12/12/2023.  Still breast-feeding.  PERTINENT  PMH / PSH: GERD  OBJECTIVE:   BP 115/61   Pulse 88   Ht 6' (1.829 m)   Wt 201 lb 3.2 oz (91.3 kg)   SpO2 100%   BMI 27.29 kg/m    General: NAD, pleasant, able to participate in exam Cardiac: RRR, no murmurs. Respiratory: CTAB, normal effort, No wheezes, rales or rhonchi Abdomen: + BS.  Soft, nondistended.  Tender palpation over epigastric region and suprapubic/pelvic region. Extremities: no edema or cyanosis. Skin: warm and dry, no rashes noted Neuro: alert, no obvious focal deficits Psych: Normal affect and mood  ASSESSMENT/PLAN:   Assessment & Plan Gastroesophageal reflux disease, unspecified whether esophagitis present Symptoms refractory to famotidine , concerning for underlying H. pylori infection therefore will obtain testing today.  Will follow-up treatment when results available. -Urea breath test Dysuria UA concerning for UTI.  Will treat and send urine culture. -Cefadroxil 500 mg twice daily x 5 days -F/u urine culture results   Dr. Jonne Netters, DO [facility]

## 2023-12-29 NOTE — Patient Instructions (Addendum)
 Byari byiza kubona wowe uyu munsi! Murakoze guhitamo [facility].Nyamuneka uzane imiti yawe yose buri gihe ku ngendo zawe.Uyu munsi twaganiriye kuri:1. Ngiye gufata imyanda yawe uyu munsi kugira ngo nsuzume niba hari infection yo mu mpyiko bitewe n'ibimenyetso byawe biva mu nda hasi. Nzahita nkwandikira ku bijyanye n'ibyo byangombwa n'uburyo bwo gukoresha antibiotiques niba bikenewe.2. Ku pain yawe yo mu gatuza ndashaka kugusuzuma kuri H. pylori uyu munsi. Bitewe n'ibyo bisubizo nzakomeza nkwandikire ku bijyanye no kuvura ibimenyetso byawe.Nyamuneka ukomeze utumenyeshe ibyavuye mu kizamini.   Turimo kugenzura laboratwari zimwe [employer]. Niba zifite ibikurikira abisanzwe, nzahamagara. Niba zisa neza, nzohereza ubutumwa bwa MyChart (niba bukora) cyangwa ibaruwa mu ibaruwa. Niba utumvise kuri laboratwari zawe mu byumweru bibiri biri imbere, nyamuneka hamagara ibiro. Hamagarira kliniki kuri [phone] niba ibimenyetso byawe bibi cyangwa ufite impungenge. [place]amuneka ukurikirane [place] muri serivisi mu biro by'imbere mbere yo kuva uyu munsi. Jonne Netters, DO Ubuvuzi bw'Umuryango   It was wonderful to see you today! Thank you for choosing [facility].   Please bring ALL of your medications with you to every visit.   Today we talked about:  I am getting your urine today to check for possible urinary tract infection given your lower abdominal symptoms.  I will follow-up with you regarding those results and if you need an antibiotic. For your epigastric pain I would like to test you for H. pylori today.  Depending on those results I will follow-up regarding treatment of your symptoms.  Please follow up pending lab results   We are checking some labs today. If they are abnormal, I will call you. If they are normal, I will send you a MyChart message (if it is active) or a letter in the mail. If you do not hear about your labs in the next 2 weeks, please call the  office.  Call the clinic at [phone] if your symptoms worsen or you have any concerns.  Please be sure to schedule follow up at the front desk before you leave today.   Jonne Netters, DO Family Medicine

## 2023-12-29 NOTE — Assessment & Plan Note (Signed)
 Symptoms refractory to famotidine , concerning for underlying H. pylori infection therefore will obtain testing today.  Will follow-up treatment when results available. -Urea breath test

## 2023-12-31 ENCOUNTER — Ambulatory Visit: Payer: Self-pay | Admitting: Family Medicine

## 2023-12-31 DIAGNOSIS — A048 Other specified bacterial intestinal infections: Secondary | ICD-10-CM

## 2023-12-31 LAB — H. PYLORI BREATH TEST: H pylori Breath Test: POSITIVE — AB

## 2023-12-31 NOTE — Telephone Encounter (Cosign Needed Addendum)
 Spoke with patient utilizing Kinyarawanda interpretor to discuss lab results. H pylori positive, she is currently breastfeeding.  Discussed Pylera is not safe with breast-feeding therefore will look into other options.  Addendum: 01/01/2024, 1:30PM Attempted to call patient x 2 with interpreter but did not answer and unable to leave voicemail.  Unfortunately no alternative for treatment while she is breast-feeding and we can consider treatment of the infection when she stops breast-feeding.  We can consider symptom support with omeprazole if she desires.  Jonne Netters, DO

## 2024-01-01 LAB — URINE CULTURE

## 2024-01-02 NOTE — Telephone Encounter (Signed)
 Called patient 3x via interpretor she did not answer the phone, unable to LVM. Alain Howard CMA

## 2024-01-02 NOTE — Telephone Encounter (Signed)
-----   Message from Jonne Netters, MD sent at 01/01/2024  1:36 PM EDT -----  For some reason I cannot route this to the red pool but can you give try this patient one more time to let her know I cannot treat her H. pylori infection while she is breast-feeding as there are no  alternative that are safe.  I tried to call her with an interpretor two separate times but could not get a hold of her. Thank you! ----- Message ----- From: Angelita Kendall, CMA Sent: 12/29/2023  11:25 AM EDT To: Jonne Netters, MD

## 2024-01-02 NOTE — Telephone Encounter (Signed)
 Spoke with patient and she now wants to stop breastfeeding since she is still sick and wants to start taking meds. Patient has been scheduled for next week with Dr. Dameron for further discussion since PCP has no availability until July. Alain Howard CMA

## 2024-01-02 NOTE — Telephone Encounter (Signed)
-----   Message from [employer] sent at 01/02/2024  4:11 PM EDT ----- She called back and I sent her to [facility] Lightner. ----- Message ----- From: Jonne Netters, MD Sent: 01/02/2024   4:02 PM EDT To: Bettyjane Brunet Admin  Can you please try to call patient to schedule an appointment to discuss results since we cannot reach her via phone thus far? Thanks! ----- Message ----- From: Alain Howard, CMA Sent: 01/02/2024   3:55 PM EDT To: Jonne Netters, MD  Hey I tried 3x no answer and cannot lvm. Alain Howard CMA ----- Message ----- From: Jonne Netters, MD Sent: 01/01/2024   1:36 PM EDT To: Murlene Army, CMA; Alain Howard, CMA  ----- Message from Jonne Netters, MD sent at 01/01/2024  1:36 PM EDT -----  For some reason I cannot route this to the red pool but can you give try this patient one more time to let her know I cannot treat her H. pylori infection while she is breast-feeding as there are no  alternative that are safe.  I tried to call her with an interpretor two separate times but could not get a hold of her. Thank you! ----- Message ----- From: Angelita Kendall, CMA Sent: 12/29/2023  11:25 AM EDT To: Jonne Netters, MD

## 2024-01-06 ENCOUNTER — Encounter: Payer: Self-pay | Admitting: Student

## 2024-01-06 ENCOUNTER — Ambulatory Visit (INDEPENDENT_AMBULATORY_CARE_PROVIDER_SITE_OTHER): Payer: Self-pay | Admitting: Student

## 2024-01-06 VITALS — BP 118/65 | HR 63 | Temp 98.3°F | Ht 72.0 in | Wt 205.4 lb

## 2024-01-06 DIAGNOSIS — A048 Other specified bacterial intestinal infections: Secondary | ICD-10-CM

## 2024-01-06 MED ORDER — METRONIDAZOLE 500 MG PO TABS: 500.0000 mg | ORAL_TABLET | Freq: Four times a day (QID) | ORAL | 0 refills | Status: AC

## 2024-01-06 MED ORDER — TETRACYCLINE HCL 500 MG PO CAPS: 500.0000 mg | ORAL_CAPSULE | Freq: Four times a day (QID) | ORAL | 0 refills | Status: AC

## 2024-01-06 MED ORDER — OMEPRAZOLE 20 MG PO CPDR: 20.0000 mg | DELAYED_RELEASE_CAPSULE | Freq: Two times a day (BID) | ORAL | 0 refills | Status: DC

## 2024-01-06 MED ORDER — BISMUTH SUBSALICYLATE 525 MG PO TABS: 1.0000 | ORAL_TABLET | Freq: Four times a day (QID) | ORAL | 0 refills | Status: AC

## 2024-01-06 NOTE — Progress Notes (Signed)
    SUBJECTIVE:   CHIEF COMPLAINT / HPI:   *Utilized in person Kinyarwanda interpreter for entirety of visit   H pylori + urea breath test  on 12/29/2023 -Symptomatic with epigastric pain -Breastfeeding her 56+ year old child, PCP relayed concerns that quadruple therapy was not safe in h pylori infection -Here today to to discuss treatment  PERTINENT  PMH / PSH: GERD, Breastfeeding, Non-english speaking patient  OBJECTIVE:   BP 118/65   Pulse 63   Temp 98.3 F (36.8 C)   Ht 6' (1.829 m)   Wt 205 lb 6.4 oz (93.2 kg)   SpO2 98%   Breastfeeding Yes   BMI 27.86 kg/m   General: NAD, well appearing Neuro: A&O Respiratory: normal WOB on RA.  Extremities: Moving all 4 extremities equally  ASSESSMENT/PLAN:   H. pylori infection Quadruple therapy not absolute contraindicated in breastfeeding, especially given patient's child is >3 months of age Discussed risk/benefit, pt wishes to proceed with therapy Sent the following to pharmacy (currently uninsured, but says 75$ is payable for her) - Bismuth sulfate 525 mg four times daily for 14 days - Metronidazole 500 mg four times daily for 14 days - Tetracycline 500 mg four times daily for 14 days - Omeprazole 20 mg twice daily for 14 days Patient to pick up prescriptions and bring them to her visit on 6/20 so that Dr. Rochelle Chu can explain/provide handout how to take each medicine/number of times per day.     Etienne Mowers, DO [facility]

## 2024-01-06 NOTE — Assessment & Plan Note (Signed)
 Quadruple therapy not absolute contraindicated in breastfeeding, especially given patient's child is >3 months of age Discussed risk/benefit, pt wishes to proceed with therapy Sent the following to pharmacy (currently uninsured, but says 75$ is payable for her) - Bismuth sulfate 525 mg four times daily for 14 days - Metronidazole 500 mg four times daily for 14 days - Tetracycline 500 mg four times daily for 14 days - Omeprazole 20 mg twice daily for 14 days Patient to pick up prescriptions and bring them to her visit on 6/20 so that Dr. Rochelle Chu can explain/provide handout how to take each medicine/number of times per day.

## 2024-01-06 NOTE — Patient Instructions (Signed)
 It was great seeing you today.  As we discussed, - I sent the medicines to your pharmacy. Please go pick them up and bring them in for a visit   If you have any questions or concerns, please feel free to call the clinic.   Have a wonderful day,  Dr. Vallorie Gayer [facility] [phone]

## 2024-01-08 ENCOUNTER — Ambulatory Visit: Payer: Self-pay | Admitting: Family Medicine

## 2024-01-08 NOTE — Progress Notes (Deleted)
    SUBJECTIVE:   CHIEF COMPLAINT / HPI:   F/u H pylori/medication teaching Patient had positive urea breath test on 12/29/2023 and has been symptomatic with epigastric pain.  She continues to breast-feed her 30+-year-old child, though this is not an absolute contraindication to treatment of H. pylori. At last visit she was prescribed quadruple therapy; she was able to pick these up at the pharmacy and presents today for medication teaching. She continues to have epigastric pain***   - Bismuth sulfate 525 mg four times daily for 14 days - Metronidazole 500 mg four times daily for 14 days - Tetracycline 500 mg four times daily for 14 days - Omeprazole 20 mg twice daily for 14 days ***  PERTINENT  PMH / PSH: ***  OBJECTIVE:   There were no vitals taken for this visit.  ***  General: ***-appearing, no acute distress. HEENT: normocephalic, PERRLA, MMM, bilateral TM visualized without erythema or bulging. Cardio: Regular rate, *** rhythm, no murmurs on exam. Pulm: Clear, no wheezing, no crackles. No increased work of breathing. Abdominal: bowel sounds present, soft, non-tender, non-distended. Extremities: no peripheral edema. Moves all extremities equally. Neuro: Alert and oriented x3, speech normal in content, no facial asymmetry, strength intact and equal bilaterally in UE and LE, pupils equal and reactive to light.  Psych:  Cognition and judgment appear intact. Alert, communicative, and cooperative with normal attention span and concentration. No apparent delusions, illusions, hallucinations    ASSESSMENT/PLAN:   Assessment & Plan      Omar Bibber, DO [facility]

## 2024-01-09 ENCOUNTER — Ambulatory Visit: Payer: Self-pay | Admitting: Family Medicine

## 2024-01-12 ENCOUNTER — Ambulatory Visit (INDEPENDENT_AMBULATORY_CARE_PROVIDER_SITE_OTHER): Payer: Self-pay

## 2024-01-12 VITALS — BP 116/67 | HR 62 | Wt 204.8 lb

## 2024-01-12 DIAGNOSIS — A048 Other specified bacterial intestinal infections: Secondary | ICD-10-CM

## 2024-01-12 NOTE — Assessment & Plan Note (Addendum)
 Called patient's pharmacy, they confirmed that the tetracycline and metronidazole be ready for her to pick up tomorrow morning after 8:00. We discussed and I wrote down how to take the medications in English and in Kinyarwanda (see AVS).  Patient states after she picks her medications, if she is unsure how to take them, she will schedule another appointment so that we can provide her with a chart to further discuss. Will need retesting in 2 weeks after completing treatment

## 2024-01-12 NOTE — Patient Instructions (Signed)
 It was great to see you! Thank you for allowing me to participate in your care!  I recommend that you always bring your medications to each appointment as this makes it easy to ensure you are on the correct medications and helps us  not miss when refills are needed.  Our plans for today:  The metronidazole and tetracycline will be available after 8 am tomorrow   Metronidazole na tetracycline bizaboneka nyuma ya saa munani z'ejo  If you are confused about how to take these medications, please return and bring them with you  Niba witiranya uburyo bwo gufata iyi miti, nyamuneka garuka uzane nawe   - Bismuth sulfate 525 mg four times daily for 14 days  - Metronidazole 500 mg four times daily for 14 days  - Tetracycline 500 mg four times daily for 14 days  - Omeprazole 20 mg twice daily for 14 days     - Bismuth sulfate 525 mg inshuro enye buri munsi muminsi 14 - Metronidazole 500 mg inshuro enye buri munsi muminsi 14 - Tetracycline 500 mg inshuro enye buri munsi muminsi 14 - Omeprazole 20 mg kabiri kumunsi iminsi 14   We are checking some labs today, I will call you if they are abnormal will send you a MyChart message or a letter if they are normal.  If you do not hear about your labs in the next 2 weeks please let us  know.  Take care and seek immediate care sooner if you develop any concerns.   Dr. Lauraine Molt, DO [facility]

## 2024-01-12 NOTE — Progress Notes (Signed)
    SUBJECTIVE:   CHIEF COMPLAINT / HPI:   F/u H pylori/medication teaching Patient had positive urea breath test on 12/29/2023 and has been symptomatic with epigastric pain.  She continues to breast-feed her 40+-year-old child, though this is not an absolute contraindication to treatment of H. Pylori which was discussed at last visit.  At last visit she was prescribed quadruple therapy but only has pepto bismol and omeprazole with her today and says these are the only medications she was given from the pharmacy. Says she went to the pharmacy twice to get the other medications but was told they didn't have any more meds for her.    PERTINENT  PMH / PSH: H/ Plylori, breast feeding  OBJECTIVE:   BP 116/67   Pulse 62   Wt 204 lb 12.8 oz (92.9 kg)   SpO2 99%   Breastfeeding Yes   BMI 27.78 kg/m    General: NAD, pleasant, well appearing  Cardiac: well perfused Respiratory:NWOB Skin: warm and dry, no rashes noted Neuro: alert, no obvious focal deficits Psych: Normal affect and mood  ASSESSMENT/PLAN:   H. pylori infection Called patient's pharmacy, they confirmed that the tetracycline and metronidazole be ready for her to pick up tomorrow morning after 8:00. We discussed and I wrote down how to take the medications in English and in Kinyarwanda (see AVS).  Patient states after she picks her medications, if she is unsure how to take them, she will schedule another appointment so that we can provide her with a chart to further discuss. Will need retesting in 2 weeks after completing treatment     Dr. Lauraine Molt, DO [place] [facility]

## 2024-01-19 ENCOUNTER — Other Ambulatory Visit: Payer: Self-pay | Admitting: Student

## 2024-03-11 ENCOUNTER — Ambulatory Visit: Payer: Self-pay

## 2024-03-11 DIAGNOSIS — Z30019 Encounter for initial prescription of contraceptives, unspecified: Secondary | ICD-10-CM | POA: Diagnosis not present

## 2024-03-11 MED ORDER — MEDROXYPROGESTERONE ACETATE 150 MG/ML IM SUSP
150.0000 mg | Freq: Once | INTRAMUSCULAR | Status: AC
Start: 2024-03-11 — End: 2024-03-11
  Administered 2024-03-11: 150 mg via INTRAMUSCULAR

## 2024-03-11 NOTE — Progress Notes (Signed)
 Patient here today for Depo Provera  injection and is within her dates.     Last contraceptive appt was 01/08/2023.   Depo given in RUOQ today.  Site unremarkable & patient tolerated injection.     Next injection due 05/27/2024-06/10/2024.  Reminder card given.    Patient scheduled for depo renewal with PCP on 04/07/2024.   Kinyarwanda interpreter [phone] used for entire visit.

## 2024-04-06 NOTE — Progress Notes (Deleted)
    SUBJECTIVE:   CHIEF COMPLAINT / HPI:   H pylori Received treatment in 01/12/2024, follow-up for test of cure.  Contraception On Depo for the past year after the birth of her last child.  PERTINENT  PMH / PSH: ***  OBJECTIVE:   There were no vitals taken for this visit. ***  General: NAD, pleasant, able to participate in exam Cardiac: RRR, no murmurs. Respiratory: CTAB, normal effort, No wheezes, rales or rhonchi Abdomen: Bowel sounds present, nontender, nondistended Extremities: no edema or cyanosis. Skin: warm and dry, no rashes noted Neuro: alert, no obvious focal deficits Psych: Normal affect and mood  ASSESSMENT/PLAN:   No problem-specific Assessment & Plan notes found for this encounter.     Dr. Izetta Nap, DO [facility]    {    This will disappear when note is signed, click to select method of visit    :1}

## 2024-04-07 ENCOUNTER — Ambulatory Visit: Admitting: Family Medicine

## 2024-04-09 NOTE — Progress Notes (Signed)
    SUBJECTIVE:   CHIEF COMPLAINT / HPI:   H pylori Received treatment in 01/12/2024, follow-up for test of cure. Improvement in symptoms but still having some discomfort. Has not been taking Omeprazole .   Right arm pain When she goes to sleep, particularly on that R side. Does not occur when sleeping on the L side. Goes away with use. Right handed.   Contraception On Depo for the past year after the birth of her last child. Desires to have one more child. Reports she had difficulty after coming off Depo last time getting pregnant but then became pregnant when she did not want to be. Still breast feeding.  PERTINENT  PMH / PSH: H. pylori infection  OBJECTIVE:   BP (!) 99/46   Pulse 74   Wt 198 lb 9.6 oz (90.1 kg)   SpO2 100%   BMI 26.94 kg/m    General: Alert, no apparent distress, well groomed HEENT: Normocephalic, atraumatic, moist mucus membranes, neck supple Respiratory: Normal respiratory effort GI: Non-distended Skin: No rashes, no jaundice Psych: Appropriate mood and affect MSK: Right arm - No erythema or deformity - Nontender to palpation today but gestures over medial posterior elbow for pain area. - Full ROM of right shoulder, elbow and wrist without pain - Negative Neer and Hawkins  ASSESSMENT/PLAN:   Assessment & Plan H. pylori infection Completed treatment, repeat urea breath test today.  Continues to have some reflux, if negative will restart PPI. Encounter for initial prescription of contraceptive pills Currently on Depo, next injection 05/2024.  Does desire another child, discussed transitioning off Depo to POPs to ensure she is able to get pregnant when desired.  Still breast-feeding. -Start Micronor  in 05/2024 -Restart prenatal vitamin when preparing to try to conceive Right arm pain Normal exam, likely positional compression when sleeping on her right side.  Will trial alternative sleep position and support care.  Return if persistent pain. Encounter  for immunization Received flu shot    Dr. Izetta Nap, DO [facility] Center

## 2024-04-14 ENCOUNTER — Encounter: Payer: Self-pay | Admitting: Family Medicine

## 2024-04-14 ENCOUNTER — Ambulatory Visit (INDEPENDENT_AMBULATORY_CARE_PROVIDER_SITE_OTHER): Admitting: Family Medicine

## 2024-04-14 VITALS — BP 99/46 | HR 74 | Wt 198.6 lb

## 2024-04-14 DIAGNOSIS — A048 Other specified bacterial intestinal infections: Secondary | ICD-10-CM

## 2024-04-14 DIAGNOSIS — M79601 Pain in right arm: Secondary | ICD-10-CM

## 2024-04-14 DIAGNOSIS — Z23 Encounter for immunization: Secondary | ICD-10-CM

## 2024-04-14 DIAGNOSIS — Z30011 Encounter for initial prescription of contraceptive pills: Secondary | ICD-10-CM | POA: Diagnosis not present

## 2024-04-14 MED ORDER — NORETHINDRONE 0.35 MG PO TABS
1.0000 | ORAL_TABLET | Freq: Every day | ORAL | 5 refills | Status: AC
Start: 2024-04-14 — End: ?

## 2024-04-14 MED ORDER — PRENATAL VITAMIN 27-0.8 MG PO TABS
1.0000 | ORAL_TABLET | Freq: Every day | ORAL | 11 refills | Status: AC
Start: 2024-04-14 — End: ?

## 2024-04-14 NOTE — Assessment & Plan Note (Signed)
 Completed treatment, repeat urea breath test today.  Continues to have some reflux, if negative will restart PPI.

## 2024-04-14 NOTE — Patient Instructions (Addendum)
 Byari byiza cyane bing uyu munsi! Urakoze guhitamo Ubuvuzi bwumuryango [facility].   Nyamuneka uzane imiti yawe yose hamwe nawe igihe cyose usuye.   Uyu munsi twaganiriye:  1. Tuzasubiramo ibizamini byanduye munda yawe uyumunsi.  Niba ibyo bizamini ari bibi nshobora kohereza mumiti yo kugaruka kugirango ndebe niba bizafasha ibimenyetso byawe.  Niba ari byiza noneho nzakenera kukwohereza kwa guinea wa GI kugirango ukore isuzuma kuko bivuze ko ufite infection [employer]. 2. Ntekereza ko ububabare bw'ukuboko kwawe kw'iburyo buterwa no kwikuramo imitsi iyo uryamye.  Ntekereza ko gusubiramo nijoro no gufata umusego cyangwa [place] umusego munsi yukuboko kwawe bishobora gufasha muribi.  Niba ufite ibimenyetso bitagenda cyangwa bikomeza nubwo izi mpinduka nyamuneka umbwire. 3. Utwikiriwe no benjamin [place] na Depo alean 20/20/2025.  Nyuma yicyo gihe nohereje mu binini byo mu kanwa ushobora gufata buri munsi kugirango wirinde kuringaniza imbyaro kugeza igihe witeguye kongera gusama.  Ni byiza gufata mugihe konsa bitazagira ingaruka kumata yawe.  Nyamuneka mugihe utangiye kugerageza gusama wongere ufate vitamine mbere yo kubyara buri munsi.  Nyamuneka kurikira nkuko bikenewe kubimenyetso simusiga hamwe numubiri wumwaka   Turimo gusuzuma laboratoire uyu munsi. Niba bidasanzwe, nzaguhamagara. Niba ari ibisanzwe, nzakoherereza ubutumwa bwa MyChart (niba bukora) cyangwa ibaruwa muri posita. Niba utumva ibya laboratoire yawe mubyumweru 2 biri imbere, nyamuneka hamagara biro.  Hamagara ivuriro kuri [phone] niba ibimenyetso byawe bikabije cyangwa ufite impungenge.  Nyamuneka wemeze guteganya gukurikira kumeza mbere yuko ugenda uyumunsi.   Izetta Nap, MAINE Ubuvuzi bwumuryango  It was wonderful to see you today! Thank you for choosing [facility].   Please bring ALL of your medications with you to every visit.   Today we talked about:  We will repeat testing for the infection in  your gut today.  If that testing is negative I can send in the medication for reflux to see if it will help your symptoms.  If it is positive then I will need to refer you to the GI doctor for further evaluation as this means you have a resistant infection. I think the pain in your right arm is due to compression of the nerves when you are sleeping.  I think repositioning at night and holding a pillow or putting a pillow under your arm can help with this.  If you have symptoms that do not go away or persist despite these changes please let me know. You are covered for contraception by Depo through 06/10/2024.  After that time I did send in the oral pill that you can take daily for contraception until you are ready to become pregnant again.  It is safe to take when breast-feeding will not affect your milk supply.  Please when you start to try to become pregnant again take a prenatal vitamin daily.  Please follow up as needed for persistent symptoms and annual physical   We are checking some labs today. If they are abnormal, I will call you. If they are normal, I will send you a MyChart message (if it is active) or a letter in the mail. If you do not hear about your labs in the next 2 weeks, please call the office.  Call the clinic at [phone] if your symptoms worsen or you have any concerns.  Please be sure to schedule follow up at the front desk before you leave today.   Izetta Nap, DO Family Medicine

## 2024-04-16 ENCOUNTER — Other Ambulatory Visit: Payer: Self-pay | Admitting: Family Medicine

## 2024-04-16 ENCOUNTER — Ambulatory Visit: Payer: Self-pay | Admitting: Family Medicine

## 2024-04-16 DIAGNOSIS — K219 Gastro-esophageal reflux disease without esophagitis: Secondary | ICD-10-CM

## 2024-04-16 LAB — H. PYLORI BREATH TEST: H pylori Breath Test: NEGATIVE

## 2024-04-16 MED ORDER — OMEPRAZOLE 40 MG PO CPDR: 40.0000 mg | DELAYED_RELEASE_CAPSULE | Freq: Every day | ORAL | 3 refills | Status: DC

## 2024-07-09 ENCOUNTER — Other Ambulatory Visit: Payer: Self-pay | Admitting: Family Medicine

## 2024-07-09 DIAGNOSIS — Z30011 Encounter for initial prescription of contraceptive pills: Secondary | ICD-10-CM

## 2024-07-27 ENCOUNTER — Encounter: Payer: Self-pay | Admitting: Family Medicine

## 2024-07-27 ENCOUNTER — Ambulatory Visit: Payer: Self-pay | Admitting: Family Medicine

## 2024-07-27 DIAGNOSIS — K219 Gastro-esophageal reflux disease without esophagitis: Secondary | ICD-10-CM

## 2024-07-27 MED ORDER — OMEPRAZOLE 40 MG PO CPDR: 40.0000 mg | DELAYED_RELEASE_CAPSULE | Freq: Every day | ORAL | 3 refills | Status: AC

## 2024-07-27 NOTE — Patient Instructions (Addendum)
 Byari byiza cyane bing uyu munsi! Urakoze guhitamo Ubuvuzi bwumuryango [facility].   [place] uzane imiti yawe yose hamwe nawe igihe cyose usuye.   Uyu munsi twaganiriye:  1. Nohereje muri omeprazole  nifuza ko ufata burimunsi kubimenyetso byawe byo kugaruka.  Ni ngombwa cyane ko uyifata buri munsi kuko nuburyo imiti ikora neza.  Usibye iyi miti urashobora gukoresha Tum cyangwa Rolaide kugirango ugabanye ibimenyetso.  Urashobora kandi kugerageza kurenza Pepcid  nkuko bikenewe niba ufite ibimenyetso byiterambere.  Nyuma yuko ufata iyi miti ibyumweru bike niba ugifite ibimenyetso bikomeye nyamuneka unyandikire kuko dushobora gukenera kubohereza muri GI kugirango dusuzume neza.  [place]a gerageza kwirinda ibiryo byose bitera imbuto nka citrus cyane cyane mubyumweru bibiri biri imbere mugihe imiti itangiye gukurikizwa.  Ny[place]uneka kurikira mu byumweru 3-4 niba bidateye imbere  [place] ivuriro kuri [phone] niba ibimenyetso byawe bikabije cyangwa ufite impungenge.  [place]a wemeze guteganya gukurikira kumeza mbere yuko ugenda uyumunsi.   Izetta Nap, MAINE Ubuvuzi bwumuryango   It was wonderful to see you today! Thank you for choosing [facility].   Please bring ALL of your medications with you to every visit.   Today we talked about:  I did send in the omeprazole  which I would like you to take daily for your reflux symptoms.  It is very important that you take it every day as this is how the medication works best.  In addition to this medication you can use Tums or Rolaids for symptom relief.  You can also try over-the-counter Pepcid  as needed if you have breakthrough symptoms.  After you have been taking this medication for a few weeks if you are still having severe symptoms please reach out to me as we may need to send you to GI for further evaluation.  Please try to avoid any triggering foods such as citrus fruits particularly in the next couple of weeks while the  medication is taking effect.  Please follow up in 3-4 weeks if not improved  Call the clinic at [phone] if your symptoms worsen or you have any concerns.  Please be sure to schedule follow up at the front desk before you leave today.   Izetta Nap, DO Family Medicine

## 2024-07-27 NOTE — Assessment & Plan Note (Signed)
 H/o H. pylori with repeat testing negative, prescribed PPI but unable to obtain.  Presentation most consistent with persistent GERD, will trial PPI and lifestyle modifications consider GI referral if not improving. -Start omeprazole  40 mg daily -Keep food log and avoid triggers such as citrus fruits

## 2024-07-27 NOTE — Progress Notes (Signed)
" ° ° °  SUBJECTIVE:   CHIEF COMPLAINT / HPI:   Discussed the use of AI scribe software for clinical note transcription with the patient, who gave verbal consent to proceed.  Epigastric pain - Worsening stomach pain for 2 days, severe today - Pain located in the stomach, radiates to the neck - Pain wakes her from sleep - Acidic foods such as oranges exacerbate symptoms - Associated acid reflux and bloating after meals - Has not started prescribed omeprazole  due to pharmacy out of stock - No use of Tums, Maalox, or other over-the-counter medications   PERTINENT  PMH / PSH: H. pylori infection (resolved)  OBJECTIVE:   BP (!) 129/53   Pulse 75   Wt 203 lb 9.6 oz (92.4 kg)   SpO2 98%   Breastfeeding Yes   BMI 27.61 kg/m    General: NAD, pleasant, able to participate in exam Cardiac: RRR, no murmurs. Respiratory: CTAB, normal effort, No wheezes, rales or rhonchi Abdomen: Bowel sounds present, nondistended.  Tender to palpation over epigastric region.  No rebound tenderness or guarding. Skin: warm and dry, no rashes noted Neuro: alert, no obvious focal deficits Psych: Normal affect and mood  ASSESSMENT/PLAN:   Assessment & Plan Gastroesophageal reflux disease, unspecified whether esophagitis present H/o H. pylori with repeat testing negative, prescribed PPI but unable to obtain.  Presentation most consistent with persistent GERD, will trial PPI and lifestyle modifications consider GI referral if not improving. -Start omeprazole  40 mg daily -Keep food log and avoid triggers such as citrus fruits   Dr. Izetta Nap, DO [facility]     "
